# Patient Record
Sex: Female | Born: 1940 | Race: Black or African American | Hispanic: No | Marital: Single | Smoking: Current every day smoker
Health system: Southern US, Community
[De-identification: ages and names within clinical notes are randomized; demographics above are authoritative.]

## PROBLEM LIST (undated history)

## (undated) DIAGNOSIS — I639 Cerebral infarction, unspecified: Secondary | ICD-10-CM

## (undated) DIAGNOSIS — E785 Hyperlipidemia, unspecified: Secondary | ICD-10-CM

## (undated) DIAGNOSIS — I482 Chronic atrial fibrillation, unspecified: Secondary | ICD-10-CM

## (undated) DIAGNOSIS — I10 Essential (primary) hypertension: Secondary | ICD-10-CM

## (undated) DIAGNOSIS — Z9981 Dependence on supplemental oxygen: Secondary | ICD-10-CM

## (undated) DIAGNOSIS — C3490 Malignant neoplasm of unspecified part of unspecified bronchus or lung: Secondary | ICD-10-CM

## (undated) DIAGNOSIS — I509 Heart failure, unspecified: Secondary | ICD-10-CM

## (undated) DIAGNOSIS — J449 Chronic obstructive pulmonary disease, unspecified: Secondary | ICD-10-CM

## (undated) DIAGNOSIS — I5030 Unspecified diastolic (congestive) heart failure: Secondary | ICD-10-CM

## (undated) DIAGNOSIS — K259 Gastric ulcer, unspecified as acute or chronic, without hemorrhage or perforation: Secondary | ICD-10-CM

## (undated) DIAGNOSIS — K449 Diaphragmatic hernia without obstruction or gangrene: Secondary | ICD-10-CM

## (undated) DIAGNOSIS — I2721 Secondary pulmonary arterial hypertension: Secondary | ICD-10-CM

## (undated) DIAGNOSIS — F419 Anxiety disorder, unspecified: Secondary | ICD-10-CM

## (undated) HISTORY — DX: Hyperlipidemia, unspecified: E78.5

## (undated) HISTORY — PX: OTHER SURGICAL HISTORY: SHX169

## (undated) HISTORY — DX: Diaphragmatic hernia without obstruction or gangrene: K44.9

## (undated) HISTORY — PX: CHOLECYSTECTOMY: SHX55

## (undated) HISTORY — PX: ABDOMINAL HYSTERECTOMY: SHX81

## (undated) HISTORY — DX: Malignant neoplasm of unspecified part of unspecified bronchus or lung: C34.90

## (undated) HISTORY — PX: REPLACEMENT TOTAL KNEE BILATERAL: SUR1225

---

## 2004-01-02 ENCOUNTER — Other Ambulatory Visit: Payer: Self-pay

## 2004-09-04 ENCOUNTER — Ambulatory Visit: Payer: Self-pay | Admitting: Gastroenterology

## 2005-02-02 ENCOUNTER — Ambulatory Visit: Payer: Self-pay | Admitting: Internal Medicine

## 2005-02-12 ENCOUNTER — Emergency Department: Payer: Self-pay | Admitting: General Practice

## 2005-03-01 ENCOUNTER — Emergency Department: Payer: Self-pay | Admitting: Emergency Medicine

## 2005-03-12 ENCOUNTER — Ambulatory Visit: Payer: Self-pay | Admitting: Gastroenterology

## 2005-06-22 ENCOUNTER — Ambulatory Visit: Payer: Self-pay

## 2005-07-15 ENCOUNTER — Ambulatory Visit: Payer: Self-pay

## 2005-08-07 ENCOUNTER — Ambulatory Visit: Payer: Self-pay | Admitting: Pain Medicine

## 2005-08-18 ENCOUNTER — Ambulatory Visit: Payer: Self-pay | Admitting: Pain Medicine

## 2006-07-01 ENCOUNTER — Ambulatory Visit: Payer: Self-pay | Admitting: Internal Medicine

## 2006-09-02 ENCOUNTER — Other Ambulatory Visit: Payer: Self-pay

## 2006-09-02 ENCOUNTER — Emergency Department: Payer: Self-pay | Admitting: General Practice

## 2007-07-06 ENCOUNTER — Ambulatory Visit: Payer: Self-pay | Admitting: Internal Medicine

## 2007-07-22 ENCOUNTER — Emergency Department: Payer: Self-pay | Admitting: Emergency Medicine

## 2007-09-18 ENCOUNTER — Emergency Department: Payer: Self-pay | Admitting: Emergency Medicine

## 2008-01-24 ENCOUNTER — Ambulatory Visit: Payer: Self-pay | Admitting: Cardiology

## 2008-08-07 ENCOUNTER — Ambulatory Visit: Payer: Self-pay | Admitting: Internal Medicine

## 2008-09-04 ENCOUNTER — Inpatient Hospital Stay: Payer: Self-pay | Admitting: Internal Medicine

## 2010-10-02 ENCOUNTER — Ambulatory Visit: Payer: Self-pay | Admitting: Internal Medicine

## 2010-10-28 ENCOUNTER — Inpatient Hospital Stay: Payer: Self-pay | Admitting: Internal Medicine

## 2010-11-19 ENCOUNTER — Other Ambulatory Visit: Payer: Self-pay | Admitting: Internal Medicine

## 2011-01-20 ENCOUNTER — Ambulatory Visit: Payer: Self-pay | Admitting: Orthopedic Surgery

## 2011-02-01 ENCOUNTER — Ambulatory Visit: Payer: Self-pay | Admitting: Internal Medicine

## 2011-02-25 ENCOUNTER — Emergency Department: Payer: Self-pay | Admitting: Emergency Medicine

## 2011-03-01 ENCOUNTER — Inpatient Hospital Stay: Payer: Self-pay | Admitting: Internal Medicine

## 2011-03-03 ENCOUNTER — Ambulatory Visit: Payer: Self-pay | Admitting: Internal Medicine

## 2011-04-03 ENCOUNTER — Ambulatory Visit: Payer: Self-pay | Admitting: Internal Medicine

## 2012-02-25 ENCOUNTER — Ambulatory Visit: Payer: Self-pay | Admitting: Internal Medicine

## 2012-08-11 ENCOUNTER — Inpatient Hospital Stay: Payer: Self-pay | Admitting: Internal Medicine

## 2012-08-11 LAB — BASIC METABOLIC PANEL
Anion Gap: 8 (ref 7–16)
BUN: 13 mg/dL (ref 7–18)
Chloride: 106 mmol/L (ref 98–107)
Creatinine: 0.74 mg/dL (ref 0.60–1.30)
EGFR (African American): >60
EGFR (Non-African Amer.): >60
Glucose: 93 mg/dL (ref 65–99)
Osmolality: 281 (ref 275–301)

## 2012-08-11 LAB — HEMOGLOBIN: HGB: 12.4 g/dL (ref 12.0–16.0)

## 2012-08-11 LAB — CBC
HCT: 39.4 % (ref 35.0–47.0)
HGB: 12.8 g/dL (ref 12.0–16.0)
MCH: 26.3 pg (ref 26.0–34.0)
MCHC: 32.5 g/dL (ref 32.0–36.0)
MCV: 81 fL (ref 80–100)
RBC: 4.87 10*6/uL (ref 3.80–5.20)
WBC: 3.6 10*3/uL (ref 3.6–11.0)

## 2012-08-11 LAB — APTT: Activated PTT: 34.3 secs (ref 23.6–35.9)

## 2012-08-12 ENCOUNTER — Ambulatory Visit: Payer: Self-pay | Admitting: Internal Medicine

## 2012-08-12 LAB — BASIC METABOLIC PANEL
Anion Gap: 9 (ref 7–16)
BUN: 10 mg/dL (ref 7–18)
Chloride: 104 mmol/L (ref 98–107)
Creatinine: 0.71 mg/dL (ref 0.60–1.30)
EGFR (Non-African Amer.): >60
Glucose: 140 mg/dL — ABNORMAL HIGH (ref 65–99)
Osmolality: 279 (ref 275–301)
Potassium: 4 mmol/L (ref 3.5–5.1)

## 2012-08-12 LAB — CBC WITH DIFFERENTIAL/PLATELET
Basophil #: 0 10*3/uL (ref 0.0–0.1)
Lymphocyte #: 0.4 10*3/uL — ABNORMAL LOW (ref 1.0–3.6)
Lymphocyte %: 10.3 %
MCV: 80 fL (ref 80–100)
Monocyte %: 1.7 %
Neutrophil %: 87.8 %
Platelet: 185 10*3/uL (ref 150–440)
RBC: 4.48 10*6/uL (ref 3.80–5.20)
WBC: 3.6 10*3/uL (ref 3.6–11.0)

## 2012-08-12 LAB — LIPID PANEL
Cholesterol: 129 mg/dL (ref 0–200)
Triglycerides: 57 mg/dL (ref 0–200)
VLDL Cholesterol, Calc: 11 mg/dL (ref 5–40)

## 2012-08-12 LAB — PROTIME-INR: INR: 1.1

## 2012-08-12 LAB — TROPONIN I: Troponin-I: 0.36 ng/mL — ABNORMAL HIGH

## 2012-08-13 LAB — COMPREHENSIVE METABOLIC PANEL
Bilirubin,Total: 0.3 mg/dL (ref 0.2–1.0)
Calcium, Total: 8.7 mg/dL (ref 8.5–10.1)
Chloride: 101 mmol/L (ref 98–107)
Co2: 32 mmol/L (ref 21–32)
Creatinine: 0.77 mg/dL (ref 0.60–1.30)
EGFR (Non-African Amer.): >60
SGPT (ALT): 12 U/L (ref 12–78)
Total Protein: 7.6 g/dL (ref 6.4–8.2)

## 2012-08-13 LAB — CBC WITH DIFFERENTIAL/PLATELET
Basophil %: 0 %
Eosinophil #: 0 10*3/uL (ref 0.0–0.7)
HCT: 36.1 % (ref 35.0–47.0)
HGB: 11.8 g/dL — ABNORMAL LOW (ref 12.0–16.0)
Lymphocyte #: 0.3 10*3/uL — ABNORMAL LOW (ref 1.0–3.6)
Lymphocyte %: 6.5 %
MCH: 26.1 pg (ref 26.0–34.0)
MCHC: 32.7 g/dL (ref 32.0–36.0)
Monocyte #: 0.2 x10 3/mm (ref 0.2–0.9)
Neutrophil #: 4.7 10*3/uL (ref 1.4–6.5)
RBC: 4.52 10*6/uL (ref 3.80–5.20)

## 2012-08-15 LAB — CBC WITH DIFFERENTIAL/PLATELET
Basophil #: 0 10*3/uL (ref 0.0–0.1)
Eosinophil #: 0 10*3/uL (ref 0.0–0.7)
Eosinophil %: 0 %
HCT: 39.6 % (ref 35.0–47.0)
HGB: 12.9 g/dL (ref 12.0–16.0)
Lymphocyte #: 0.2 10*3/uL — ABNORMAL LOW (ref 1.0–3.6)
MCH: 26.1 pg (ref 26.0–34.0)
MCHC: 32.5 g/dL (ref 32.0–36.0)
MCV: 80 fL (ref 80–100)
Monocyte #: 0.5 x10 3/mm (ref 0.2–0.9)
Neutrophil %: 86.4 %
Platelet: 187 10*3/uL (ref 150–440)
RDW: 19.7 % — ABNORMAL HIGH (ref 11.5–14.5)

## 2012-08-15 LAB — BASIC METABOLIC PANEL
BUN: 27 mg/dL — ABNORMAL HIGH (ref 7–18)
Calcium, Total: 8.4 mg/dL — ABNORMAL LOW (ref 8.5–10.1)
Creatinine: 0.71 mg/dL (ref 0.60–1.30)
EGFR (African American): >60
Glucose: 126 mg/dL — ABNORMAL HIGH (ref 65–99)
Potassium: 3.5 mmol/L (ref 3.5–5.1)

## 2012-08-16 ENCOUNTER — Encounter (HOSPITAL_COMMUNITY): Payer: Self-pay | Admitting: *Deleted

## 2012-08-16 ENCOUNTER — Inpatient Hospital Stay (HOSPITAL_COMMUNITY): Payer: Medicare Other

## 2012-08-16 ENCOUNTER — Inpatient Hospital Stay (HOSPITAL_COMMUNITY)
Admission: EM | Admit: 2012-08-16 | Discharge: 2012-08-25 | DRG: 208 | Disposition: A | Discharge status: Skilled Nursing Facility | Payer: Medicare Other | Source: Other Acute Inpatient Hospital | Attending: Internal Medicine | Admitting: Internal Medicine

## 2012-08-16 ENCOUNTER — Institutional Professional Consult (permissible substitution): Payer: Medicare Other | Admitting: Radiation Oncology

## 2012-08-16 DIAGNOSIS — Z79899 Other long term (current) drug therapy: Secondary | ICD-10-CM

## 2012-08-16 DIAGNOSIS — R918 Other nonspecific abnormal finding of lung field: Secondary | ICD-10-CM | POA: Diagnosis present

## 2012-08-16 DIAGNOSIS — R7309 Other abnormal glucose: Secondary | ICD-10-CM | POA: Diagnosis not present

## 2012-08-16 DIAGNOSIS — Z9981 Dependence on supplemental oxygen: Secondary | ICD-10-CM

## 2012-08-16 DIAGNOSIS — T380X5A Adverse effect of glucocorticoids and synthetic analogues, initial encounter: Secondary | ICD-10-CM | POA: Diagnosis not present

## 2012-08-16 DIAGNOSIS — I10 Essential (primary) hypertension: Secondary | ICD-10-CM | POA: Diagnosis present

## 2012-08-16 DIAGNOSIS — J90 Pleural effusion, not elsewhere classified: Secondary | ICD-10-CM | POA: Diagnosis present

## 2012-08-16 DIAGNOSIS — Z6829 Body mass index (BMI) 29.0-29.9, adult: Secondary | ICD-10-CM

## 2012-08-16 DIAGNOSIS — E876 Hypokalemia: Secondary | ICD-10-CM | POA: Diagnosis not present

## 2012-08-16 DIAGNOSIS — I4891 Unspecified atrial fibrillation: Secondary | ICD-10-CM | POA: Diagnosis present

## 2012-08-16 DIAGNOSIS — F411 Generalized anxiety disorder: Secondary | ICD-10-CM | POA: Diagnosis present

## 2012-08-16 DIAGNOSIS — J9819 Other pulmonary collapse: Secondary | ICD-10-CM | POA: Diagnosis present

## 2012-08-16 DIAGNOSIS — K449 Diaphragmatic hernia without obstruction or gangrene: Secondary | ICD-10-CM | POA: Diagnosis present

## 2012-08-16 DIAGNOSIS — R634 Abnormal weight loss: Secondary | ICD-10-CM | POA: Diagnosis present

## 2012-08-16 DIAGNOSIS — Z8673 Personal history of transient ischemic attack (TIA), and cerebral infarction without residual deficits: Secondary | ICD-10-CM

## 2012-08-16 DIAGNOSIS — C3490 Malignant neoplasm of unspecified part of unspecified bronchus or lung: Principal | ICD-10-CM | POA: Diagnosis present

## 2012-08-16 DIAGNOSIS — Z96659 Presence of unspecified artificial knee joint: Secondary | ICD-10-CM

## 2012-08-16 DIAGNOSIS — I5030 Unspecified diastolic (congestive) heart failure: Secondary | ICD-10-CM

## 2012-08-16 DIAGNOSIS — F29 Unspecified psychosis not due to a substance or known physiological condition: Secondary | ICD-10-CM | POA: Diagnosis not present

## 2012-08-16 DIAGNOSIS — E785 Hyperlipidemia, unspecified: Secondary | ICD-10-CM | POA: Diagnosis present

## 2012-08-16 DIAGNOSIS — R000 Tachycardia, unspecified: Secondary | ICD-10-CM | POA: Diagnosis not present

## 2012-08-16 DIAGNOSIS — Z7901 Long term (current) use of anticoagulants: Secondary | ICD-10-CM

## 2012-08-16 DIAGNOSIS — B965 Pseudomonas (aeruginosa) (mallei) (pseudomallei) as the cause of diseases classified elsewhere: Secondary | ICD-10-CM | POA: Diagnosis present

## 2012-08-16 DIAGNOSIS — J9600 Acute respiratory failure, unspecified whether with hypoxia or hypercapnia: Secondary | ICD-10-CM | POA: Diagnosis present

## 2012-08-16 DIAGNOSIS — J9620 Acute and chronic respiratory failure, unspecified whether with hypoxia or hypercapnia: Secondary | ICD-10-CM | POA: Diagnosis present

## 2012-08-16 DIAGNOSIS — J9811 Atelectasis: Secondary | ICD-10-CM | POA: Diagnosis present

## 2012-08-16 DIAGNOSIS — I2789 Other specified pulmonary heart diseases: Secondary | ICD-10-CM | POA: Diagnosis present

## 2012-08-16 DIAGNOSIS — F17200 Nicotine dependence, unspecified, uncomplicated: Secondary | ICD-10-CM | POA: Diagnosis present

## 2012-08-16 DIAGNOSIS — R042 Hemoptysis: Secondary | ICD-10-CM

## 2012-08-16 DIAGNOSIS — I509 Heart failure, unspecified: Secondary | ICD-10-CM | POA: Diagnosis present

## 2012-08-16 DIAGNOSIS — J411 Mucopurulent chronic bronchitis: Secondary | ICD-10-CM | POA: Diagnosis present

## 2012-08-16 HISTORY — DX: Essential (primary) hypertension: I10

## 2012-08-16 HISTORY — DX: Anxiety disorder, unspecified: F41.9

## 2012-08-16 HISTORY — DX: Dependence on supplemental oxygen: Z99.81

## 2012-08-16 HISTORY — DX: Gastric ulcer, unspecified as acute or chronic, without hemorrhage or perforation: K25.9

## 2012-08-16 HISTORY — DX: Chronic atrial fibrillation, unspecified: I48.20

## 2012-08-16 HISTORY — DX: Chronic obstructive pulmonary disease, unspecified: J44.9

## 2012-08-16 HISTORY — DX: Secondary pulmonary arterial hypertension: I27.21

## 2012-08-16 HISTORY — DX: Cerebral infarction, unspecified: I63.9

## 2012-08-16 HISTORY — DX: Unspecified diastolic (congestive) heart failure: I50.30

## 2012-08-16 HISTORY — DX: Heart failure, unspecified: I50.9

## 2012-08-16 LAB — BASIC METABOLIC PANEL
Calcium, Total: 8.3 mg/dL — ABNORMAL LOW (ref 8.5–10.1)
Chloride: 97 mmol/L — ABNORMAL LOW (ref 98–107)
Co2: 36 mmol/L — ABNORMAL HIGH (ref 21–32)
Osmolality: 290 (ref 275–301)
Potassium: 3.4 mmol/L — ABNORMAL LOW (ref 3.5–5.1)
Sodium: 140 mmol/L (ref 136–145)

## 2012-08-16 LAB — CBC WITH DIFFERENTIAL/PLATELET
Basophil #: 0 10*3/uL (ref 0.0–0.1)
Eosinophil %: 0 %
HCT: 42.5 % (ref 35.0–47.0)
HGB: 14.1 g/dL (ref 12.0–16.0)
Monocyte #: 0.3 x10 3/mm (ref 0.2–0.9)
Monocyte %: 5.5 %
Neutrophil %: 91 %
Platelet: 188 10*3/uL (ref 150–440)
RDW: 18.8 % — ABNORMAL HIGH (ref 11.5–14.5)

## 2012-08-16 LAB — URINE MICROSCOPIC-ADD ON

## 2012-08-16 LAB — CBC
HCT: 43 % (ref 36.0–46.0)
MCH: 25.7 pg — ABNORMAL LOW (ref 26.0–34.0)
MCV: 81.7 fL (ref 78.0–100.0)
RDW: 17.9 % — ABNORMAL HIGH (ref 11.5–15.5)
WBC: 6.3 10*3/uL (ref 4.0–10.5)

## 2012-08-16 LAB — COMPREHENSIVE METABOLIC PANEL
ALT: 101 U/L — ABNORMAL HIGH (ref 0–35)
Alkaline Phosphatase: 100 U/L (ref 39–117)
BUN: 34 mg/dL — ABNORMAL HIGH (ref 6–23)
CO2: 38 mEq/L — ABNORMAL HIGH (ref 19–32)
Chloride: 95 mEq/L — ABNORMAL LOW (ref 96–112)
GFR calc Af Amer: >90 mL/min (ref >90)
Glucose, Bld: 106 mg/dL — ABNORMAL HIGH (ref 70–99)
Potassium: 3.4 mEq/L — ABNORMAL LOW (ref 3.5–5.1)
Sodium: 137 mEq/L (ref 135–145)
Total Bilirubin: 0.4 mg/dL (ref 0.3–1.2)
Total Protein: 7.6 g/dL (ref 6.0–8.3)

## 2012-08-16 LAB — URINALYSIS, ROUTINE W REFLEX MICROSCOPIC
Glucose, UA: NEGATIVE mg/dL
Ketones, ur: NEGATIVE mg/dL
Protein, ur: NEGATIVE mg/dL

## 2012-08-16 LAB — GLUCOSE, CAPILLARY: Glucose-Capillary: 126 mg/dL — ABNORMAL HIGH (ref 70–99)

## 2012-08-16 LAB — APTT: aPTT: 27 seconds (ref 24–37)

## 2012-08-16 MED ORDER — EPINEPHRINE HCL 0.1 MG/ML IJ SOLN
INTRAMUSCULAR | Status: AC
  Filled 2012-08-16: qty 10

## 2012-08-16 MED ORDER — VECURONIUM BROMIDE 10 MG IV SOLR
INTRAVENOUS | Status: AC
  Filled 2012-08-16: qty 10

## 2012-08-16 MED ORDER — CHLORHEXIDINE GLUCONATE 0.12 % MT SOLN
15.0000 mL | Freq: Two times a day (BID) | OROMUCOSAL | Status: DC
  Administered 2012-08-16 – 2012-08-25 (×18): 15 mL via OROMUCOSAL
  Filled 2012-08-16 (×19): qty 15

## 2012-08-16 MED ORDER — DEXAMETHASONE SODIUM PHOSPHATE 10 MG/ML IJ SOLN
10.0000 mg | Freq: Four times a day (QID) | INTRAMUSCULAR | Status: DC
  Administered 2012-08-16 – 2012-08-19 (×11): 10 mg via INTRAVENOUS
  Filled 2012-08-16 (×18): qty 1

## 2012-08-16 MED ORDER — DEXAMETHASONE SODIUM PHOSPHATE 4 MG/ML IJ SOLN
10.0000 mg | Freq: Four times a day (QID) | INTRAMUSCULAR | Status: DC
  Filled 2012-08-16 (×3): qty 2.5

## 2012-08-16 MED ORDER — BIMATOPROST 0.01 % OP SOLN
1.0000 [drp] | Freq: Every day | OPHTHALMIC | Status: DC
  Administered 2012-08-16 – 2012-08-24 (×9): 1 [drp] via OPHTHALMIC
  Filled 2012-08-16 (×2): qty 2.5

## 2012-08-16 MED ORDER — PANTOPRAZOLE SODIUM 40 MG IV SOLR
40.0000 mg | Freq: Every day | INTRAVENOUS | Status: DC
  Filled 2012-08-16: qty 40

## 2012-08-16 MED ORDER — SODIUM CHLORIDE 0.9 % IV SOLN
25.0000 ug/h | INTRAVENOUS | Status: DC
  Administered 2012-08-16: 50 ug/h via INTRAVENOUS
  Filled 2012-08-16 (×2): qty 50

## 2012-08-16 MED ORDER — ALBUTEROL SULFATE (5 MG/ML) 0.5% IN NEBU
2.5000 mg | INHALATION_SOLUTION | Freq: Four times a day (QID) | RESPIRATORY_TRACT | Status: DC
  Administered 2012-08-16 – 2012-08-23 (×27): 2.5 mg via RESPIRATORY_TRACT
  Filled 2012-08-16 (×27): qty 0.5

## 2012-08-16 MED ORDER — SODIUM CHLORIDE 0.9 % IV SOLN: 250.0000 mL | INTRAVENOUS | Status: DC | PRN

## 2012-08-16 MED ORDER — PANTOPRAZOLE SODIUM 40 MG IV SOLR
40.0000 mg | Freq: Two times a day (BID) | INTRAVENOUS | Status: DC
  Administered 2012-08-16 – 2012-08-24 (×15): 40 mg via INTRAVENOUS
  Filled 2012-08-16 (×19): qty 40

## 2012-08-16 MED ORDER — VECURONIUM BROMIDE 10 MG IV SOLR
5.0000 mg | Freq: Once | INTRAVENOUS | Status: AC
  Administered 2012-08-16: 5 mg via INTRAVENOUS
  Filled 2012-08-16: qty 10

## 2012-08-16 MED ORDER — EPINEPHRINE HCL 1 MG/ML IJ SOLN
1.0000 mg | Freq: Once | INTRAMUSCULAR | Status: AC
  Administered 2012-08-16: 1 mg via INTRATRACHEAL

## 2012-08-16 MED ORDER — DIGOXIN 0.05 MG/ML PO SOLN
0.1250 mg | Freq: Every day | ORAL | Status: DC
  Administered 2012-08-16: 0.125 mg
  Filled 2012-08-16 (×3): qty 2.5

## 2012-08-16 MED ORDER — SODIUM CHLORIDE 0.9 % IV SOLN
INTRAVENOUS | Status: DC
  Administered 2012-08-16: 75 mL via INTRAVENOUS
  Administered 2012-08-16: 1000 mL via INTRAVENOUS
  Administered 2012-08-17: 09:00:00 via INTRAVENOUS

## 2012-08-16 MED ORDER — FENTANYL CITRATE 0.05 MG/ML IJ SOLN
300.0000 ug | Freq: Once | INTRAMUSCULAR | Status: AC
  Administered 2012-08-16: 300 ug via INTRAVENOUS

## 2012-08-16 MED ORDER — PROPOFOL 10 MG/ML IV EMUL
5.0000 ug/kg/min | INTRAVENOUS | Status: DC
  Administered 2012-08-16: 10 ug/kg/min via INTRAVENOUS
  Filled 2012-08-16 (×2): qty 100

## 2012-08-16 MED ORDER — DIGOXIN 125 MCG PO TABS: 0.1250 mg | ORAL_TABLET | Freq: Every day | ORAL | Status: DC

## 2012-08-16 MED ORDER — INSULIN ASPART 100 UNIT/ML ~~LOC~~ SOLN
2.0000 [IU] | SUBCUTANEOUS | Status: DC
  Administered 2012-08-16 – 2012-08-17 (×3): 2 [IU] via SUBCUTANEOUS
  Administered 2012-08-17: 4 [IU] via SUBCUTANEOUS
  Administered 2012-08-17: 2 [IU] via SUBCUTANEOUS
  Administered 2012-08-18: 4 [IU] via SUBCUTANEOUS
  Administered 2012-08-18 (×5): 2 [IU] via SUBCUTANEOUS
  Administered 2012-08-19: 4 [IU] via SUBCUTANEOUS
  Administered 2012-08-19: 2 [IU] via SUBCUTANEOUS
  Administered 2012-08-19: 4 [IU] via SUBCUTANEOUS
  Administered 2012-08-19: 2 [IU] via SUBCUTANEOUS

## 2012-08-16 MED ORDER — FENTANYL BOLUS VIA INFUSION
50.0000 ug | Freq: Four times a day (QID) | INTRAVENOUS | Status: DC | PRN
  Administered 2012-08-18 (×5): 50 ug via INTRAVENOUS
  Filled 2012-08-16: qty 100

## 2012-08-16 MED ORDER — BIOTENE DRY MOUTH MT LIQD
15.0000 mL | Freq: Four times a day (QID) | OROMUCOSAL | Status: DC
  Administered 2012-08-17 – 2012-08-25 (×29): 15 mL via OROMUCOSAL

## 2012-08-16 MED ORDER — BIMATOPROST 0.03 % OP SOLN
1.0000 [drp] | Freq: Every day | OPHTHALMIC | Status: DC
  Filled 2012-08-16: qty 2.5

## 2012-08-16 MED ORDER — SODIUM CHLORIDE 0.9 % IV SOLN
50.0000 ug/h | INTRAVENOUS | Status: DC
  Administered 2012-08-16 – 2012-08-17 (×2): 50 ug/h via INTRAVENOUS
  Filled 2012-08-16 (×3): qty 50

## 2012-08-16 MED ORDER — PROPOFOL 10 MG/ML IV EMUL
5.0000 ug/kg/min | INTRAVENOUS | Status: DC
  Administered 2012-08-16: 35 ug/kg/min via INTRAVENOUS
  Administered 2012-08-16: 50 ug/kg/min via INTRAVENOUS
  Administered 2012-08-16: 35 ug/kg/min via INTRAVENOUS
  Administered 2012-08-17: 30 ug/kg/min via INTRAVENOUS
  Administered 2012-08-17: 25 ug/kg/min via INTRAVENOUS
  Administered 2012-08-17: 5 ug/kg/min via INTRAVENOUS
  Administered 2012-08-18: 20 ug/kg/min via INTRAVENOUS
  Administered 2012-08-18: 15 ug/kg/min via INTRAVENOUS
  Filled 2012-08-16 (×7): qty 100

## 2012-08-16 MED ORDER — FENTANYL CITRATE 0.05 MG/ML IJ SOLN
INTRAMUSCULAR | Status: AC
  Filled 2012-08-16: qty 6

## 2012-08-16 NOTE — H&P (Signed)
Name: ARYONNA GUNNERSON MRN: [id number] DOB: 10-Mar-1941    LOS: 0  CC: acute respiratory failure  PULMONARY / CRITICAL CARE MEDICINE  HPI:  71 year old female with a significant history of pulmonary hypertension and chronic respiratory failure on oxygen.  The patient presented to the ED, at [facility], with hemoptysis 10/10.  Reportedly had nose bleeds and hematemesis and reportedly coughed up approximately 1 cup of frank red blood.  Pt was intubated in the ED due to severe hemoptysis.  Past medical history: 1 chronic atrial fibrillation on anticoagulation 2 pulmonary artery hypertension 3 chronic respiratory failure on oxygen 4 chronic diastolic congestive heart failure 5 benign hypertension 6 hyperlipidemia 7 anxiety 8 history of gastric ulcer 9 previous stroke with left-sided weakness 10 status post hysterectomy 11 status post cholecystectomy 12 status post bilateral knee surgeries  Prior to Admission medications   Adcirca 20 mg daily Advair Diskus 250/50 one inhalation daily Altace 5 mg twice a day Colace 100 mg twice a day Imdur 60 mg daily Potassium 20 mEq twice a day Lanoxin 125 mcg daily Lasix 40 mg twice a day Lateris 5 mg daily Lumigan 0.3% ophthalmic solution one drop each eye bedtime Percocet 5/325 one tablet every 6 hours as needed for pain  Protonix 40 mg twice a day Iron daily Spiriva 1 inhalation daily Metoprolol 100 mg twice a day Ventolin 2 puffs 4 times a day Xanax 0.5 mg every 8 hours    Allergies Gabapentin and morphine  Family History Positive for coronary arteries disease. Brother had lung cancer and died of pulmonary emboli. Father died at 12 of heart disease. Mother died at 73 of heart disease  Social History The patient lives at home alone.  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  Review Of Systems: unable to obtain patient is intubated  Brief patient  description:  71 year old female presented to [facility] presented with abrupt onset of large volume of hemoptysis, resulting in respiratory failure 08/11/2012. Transferred to [facility] 08/16/2012  Current Status: Critical   Vital Signs: Pulse Rate:  [83-93] 89  (10/15 1500) Resp:  [17-37] 17  (10/15 1500) BP: (138-147)/(59-87) 139/87 mmHg (10/15 1500) SpO2:  [97 %-99 %] 98 % (10/15 1500) FiO2 (%):  [39.7 %-40 %] 39.7 % (10/15 1500) Weight:  [91.3 kg (201 lb 4.5 oz)] 91.3 kg (201 lb 4.5 oz) (10/15 1400)  Physical Examination: General:   on ventilator intubated and sedated Neuro:  Sedated unresponsive to voice stimulation  HEENT:   pupils are equal round reactive to light nasal mucosa no active bleeding  Lip symptoms no lesion Neck:   No JVD, No bruits no lymphadenopathy no thyromegaly   Cardiovascular:  S1, S2 normal. No gallops no rubs no murmurs.  Lungs:   lung fields clear Abdomen:   bowel sounds present, abdomen soft nontender Musculoskeletal:   no clubbing no edema no cyanosis  Skin:  intact no ulcers or lesions seen  Active Problems:  Major hemoptysis  Acute respiratory failure  Lung mass  Atrial fibrillation  Diastolic heart failure  Weight loss  HTN (hypertension)   ASSESSMENT AND PLAN  PULMONARY No results found for this basename: PHART:5,PCO2:5,PCO2ART:5,PO2ART:5,HCO3:5,O2SAT:5 in the last 168 hours Ventilator Settings: Vent Mode:  [-] PRVC FiO2 (%):  [39.7 %-40 %] 39.7 % Set Rate:  [12 bmp] 12 bmp Vt Set:  [500 mL] 500 mL PEEP:  [5 cmH20]  5 cmH20 CT:  CT of chest from 10/10 [facility] impression 1.  No pulmonary embolus to 2. large subcarinal lymph nodes and probable right lower lobe mass. Malignancy could present in this fashion CXR:  10/10 from [facility] persistent abnormal appearance that appears to be in the right lung base likely in the middle lobe suggestive of chronic atelectasis.  Loculated effusion is not completely excluded.  ETT:  From chest x-ray on 10/10 [facility] the ET tube is at the level of the upper margin of the aortic arch and appears in good position Cytology 10/15>>>  A:   Acute  Respiratory failure related to large quantity of hemoptysis. Has h/o oxygen dependence. Unclear why. Also carries h/o PAH, unclear if this was identified by right heart cath or ECHO Hemoptysis  due to a lung neoplasm and prob c/b anticoagulation Lung mass  Extensive endobronchial tumor burden on bedside Bronchoscopy with complete obstruction of bronchus intermedius.  P:   Cont heavy sedation Cont full vent support, may need lower TV with unilateral lung process, abg prior goal to lower O2 needs F/u on cytology closely No active bleeding, limited role embolization, d/w IR, appreciated Decadron addition Transfer to [facility] for prob XRT Chest x-ray in a.m and now  CARDIOVASCULAR No results found for this basename: TROPONINI:5,LATICACIDVEN:5, O2SATVEN:5,PROBNP:5 in the last 168 hours ECG:  Sinus rhythm Lines: Triple-lumen catheter right IJ 10/10 from [facility] ECHO:  Echo performed at [facility] 10/11 LV systolic function is normal EF is greater than 55% right ventricle is moderately dilated. The right atrium is moderately dilated.  A:   H/o diastolic dysfxn pulmonary hypertension Atrial fib P:  Continue to monitor her blood pressure Rate control only No anticoagulation in setting of bleeding lung lesion  Tolerating propofol at this stage Avoid tachy with diastolic heart dz Need to re eval pulm pressures on echo report  RENAL No results found for this basename: NA:5,K:2,CL:5,CO2:5,BUN:5,CREATININE:5,CALCIUM:5,MG:5,PHOS:5 in the last 168 hours Intake/Output      10/14 0701 - 10/15 0700 10/15 0701 - 10/16 0700   I.V. (mL/kg)  6.8 (0.1)   Total Intake(mL/kg)  6.8 (0.1)   Net  +6.8         Foley:  From  [facility] 10/10  A:  No problems P:   Continue to monitor urine output Chem  foley  GASTROINTESTINAL No results found for this basename: AST:5,ALT:5,ALKPHOS:5,BILITOT:5,PROT:5,ALBUMIN:5 in the last 168 hours  A:  Weight loss >50-60 lbs unintentional one year  P:   Orogastric tube in place Consider tube feeding soon ppi  HEMATOLOGIC No results found for this basename: HGB:5,HCT:5,PLT:5,INR:5,APTT:5 in the last 168 hours A:   Hemoptysis probably due to to pulmonary malignancy  hemoglobin and hematocrit from [facility] 10/10 12.8/39.4 P:  CBC on admission and follow CBCs daily Chest PT/PTT INR Monitor platelets Give fresh frozen plasma if necessary Bronch without major bleeding now, no role embolization at this stage  INFECTIOUS No results found for this basename: WBC:5,PROCALCITON:5 in the last 168 hours Cultures: Obtained at 10/10 [facility] >>> no growth as of 10/13 Antibiotics: None  A:  No infections noted WBC from outside labs 5.4 no signs of infection P:    continue to monitor CBC, wbc Steroids added  ENDOCRINE No results found for this basename: GLUCAP:5 in the last 168 hours A:  No known problems P:   Serum glucose via labs from  outside hospital 126 10/14 decadron added, ssi addition  NEUROLOGIC  A:  Sedated on sedative drips/propofol P:   Reassess neuro status when patient does sedation vacation  For now will remain on heavy sedation due to hemoptysis  Will need head MRI for mets after path dx  BEST PRACTICE / DISPOSITION Level of Care:  Intensive Care Primary Service:   Consultants:  None Code Status:  Full code Diet:  N.p.o. DVT Px:  SCDs GI Px:  Protonix Skin Integrity:  Intact Social / Family:  Family at bedside, updated multiple times.  Ccm time 45 min   Mcarthur Rossetti. Tyson Alias, MD, FACP Pgr: [phone] [facility]

## 2012-08-16 NOTE — Procedures (Signed)
Bronchoscopy Procedure Note Meredith Patton [id number] Feb 01, 1941  Procedure: Bronchoscopy Indications: Diagnostic evaluation of the airways  Procedure Details Consent: Risks of procedure as well as the alternatives and risks of each were explained to the (patient/caregiver).  Consent for procedure obtained. Time Out: Verified patient identification, verified procedure, site/side was marked, verified correct patient position, special equipment/implants available, medications/allergies/relevent history reviewed, required imaging and test results available.  Performed  In preparation for procedure, patient was given 100% FiO2. Sedation: Muscle relaxants  Airway entered and the following bronchi were examined: RUL, RML, RLL, LUL, LLL and Bronchi.   Procedures performed: Brushings not performed Bronchoscope removed.  , Patient placed back on 100% FiO2 at conclusion of procedure.    Evaluation Hemodynamic Status: BP stable throughout; O2 sats: stable throughout Patient's Current Condition: stable Specimens:  Sent serosanguinous fluid- bloody Complications: No apparent complications Patient did tolerate procedure well.   Meredith Patton. 08/16/2012  Evaluation mass, bleeding localization  1. RUL WNL 2. 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 Lavage done at [facility], sent cytology, mild oozing post, 2 cc 1:1000 diluated in 9 cc, placed, no bleeding 4. Left WNL  Meredith Patton. Meredith Alias, MD, FACP Pgr: [phone] [facility]

## 2012-08-17 ENCOUNTER — Ambulatory Visit
Admit: 2012-08-17 | Discharge: 2012-08-17 | Disposition: A | Discharge status: Home / Self Care | Payer: Medicare Other | Attending: Radiation Oncology | Admitting: Radiation Oncology

## 2012-08-17 ENCOUNTER — Other Ambulatory Visit: Payer: Self-pay

## 2012-08-17 ENCOUNTER — Encounter: Payer: Self-pay | Admitting: Radiation Oncology

## 2012-08-17 ENCOUNTER — Inpatient Hospital Stay (HOSPITAL_COMMUNITY): Payer: Medicare Other

## 2012-08-17 DIAGNOSIS — R042 Hemoptysis: Secondary | ICD-10-CM

## 2012-08-17 DIAGNOSIS — I5030 Unspecified diastolic (congestive) heart failure: Secondary | ICD-10-CM

## 2012-08-17 DIAGNOSIS — R9431 Abnormal electrocardiogram [ECG] [EKG]: Secondary | ICD-10-CM | POA: Insufficient documentation

## 2012-08-17 DIAGNOSIS — R634 Abnormal weight loss: Secondary | ICD-10-CM

## 2012-08-17 DIAGNOSIS — I10 Essential (primary) hypertension: Secondary | ICD-10-CM

## 2012-08-17 DIAGNOSIS — I4510 Unspecified right bundle-branch block: Secondary | ICD-10-CM | POA: Insufficient documentation

## 2012-08-17 DIAGNOSIS — R222 Localized swelling, mass and lump, trunk: Secondary | ICD-10-CM

## 2012-08-17 DIAGNOSIS — I4891 Unspecified atrial fibrillation: Secondary | ICD-10-CM

## 2012-08-17 DIAGNOSIS — R918 Other nonspecific abnormal finding of lung field: Secondary | ICD-10-CM

## 2012-08-17 DIAGNOSIS — J9600 Acute respiratory failure, unspecified whether with hypoxia or hypercapnia: Secondary | ICD-10-CM

## 2012-08-17 LAB — BLOOD GAS, ARTERIAL
Acid-Base Excess: 10.1 mmol/L — ABNORMAL HIGH (ref 0.0–2.0)
Bicarbonate: 34 mEq/L — ABNORMAL HIGH (ref 20.0–24.0)
Drawn by: 129801
FIO2: 0.4 %
MECHVT: 480 mL
O2 Saturation: 92.3 %
PEEP: 5 cmH2O
Patient temperature: 98.6
RATE: 16 resp/min
TCO2: 35.4 mmol/L (ref 0–100)
pCO2 arterial: 44.3 mmHg (ref 35.0–45.0)
pH, Arterial: 7.497 — ABNORMAL HIGH (ref 7.350–7.450)
pO2, Arterial: 69.7 mmHg — ABNORMAL LOW (ref 80.0–100.0)

## 2012-08-17 LAB — CBC
HCT: 41.8 % (ref 36.0–46.0)
MCHC: 31.8 g/dL (ref 30.0–36.0)
MCV: 81.2 fL (ref 78.0–100.0)
RDW: 17.8 % — ABNORMAL HIGH (ref 11.5–15.5)

## 2012-08-17 LAB — TROPONIN I: Troponin I: <0.3 ng/mL (ref <0.30)

## 2012-08-17 LAB — CBC WITH DIFFERENTIAL/PLATELET
Eosinophils Absolute: 0 10*3/uL (ref 0.0–0.7)
Hemoglobin: 13.4 g/dL (ref 12.0–15.0)
Lymphocytes Relative: 7 % — ABNORMAL LOW (ref 12–46)
Lymphs Abs: 0.4 10*3/uL — ABNORMAL LOW (ref 0.7–4.0)
MCH: 25.8 pg — ABNORMAL LOW (ref 26.0–34.0)
MCV: 81.3 fL (ref 78.0–100.0)
Monocytes Relative: 9 % (ref 3–12)
Neutrophils Relative %: 85 % — ABNORMAL HIGH (ref 43–77)
RBC: 5.2 MIL/uL — ABNORMAL HIGH (ref 3.87–5.11)
WBC: 6.1 10*3/uL (ref 4.0–10.5)

## 2012-08-17 LAB — GLUCOSE, CAPILLARY
Glucose-Capillary: 133 mg/dL — ABNORMAL HIGH (ref 70–99)
Glucose-Capillary: 118 mg/dL — ABNORMAL HIGH (ref 70–99)
Glucose-Capillary: 93 mg/dL (ref 70–99)

## 2012-08-17 LAB — BASIC METABOLIC PANEL
BUN: 34 mg/dL — ABNORMAL HIGH (ref 6–23)
Creatinine, Ser: 0.56 mg/dL (ref 0.50–1.10)
GFR calc Af Amer: >90 mL/min (ref >90)
GFR calc non Af Amer: >90 mL/min (ref >90)

## 2012-08-17 LAB — CULTURE, BLOOD (SINGLE)

## 2012-08-17 LAB — CK TOTAL AND CKMB (NOT AT ARMC)
CK, MB: 1.6 ng/mL (ref 0.3–4.0)
Total CK: 21 U/L (ref 7–177)

## 2012-08-17 MED ORDER — ATROPINE SULFATE 1 MG/ML IJ SOLN
INTRAMUSCULAR | Status: AC
  Filled 2012-08-17: qty 1

## 2012-08-17 MED ORDER — IOHEXOL 300 MG/ML  SOLN
100.0000 mL | Freq: Once | INTRAMUSCULAR | Status: AC | PRN
  Administered 2012-08-17: 100 mL via INTRAVENOUS

## 2012-08-17 MED ORDER — OXEPA PO LIQD
1000.0000 mL | ORAL | Status: DC
  Administered 2012-08-17: 1000 mL
  Filled 2012-08-17 (×3): qty 1000

## 2012-08-17 NOTE — Progress Notes (Signed)
71 year old. Female. Lives alone. Current PPD smoker. 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  Acute respiratory failure secondary to massive hemoptysis. Ventilator support and sedation initiated in emergency room at [facility]. 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   AX: gabapentin and morphine No hx of radiation therapy No indication of a pacemaker

## 2012-08-17 NOTE — H&P (Signed)
Name: Meredith Patton MRN: [id number] DOB: [birth year]    LOS: 1  CC: acute respiratory failure  PULMONARY / CRITICAL CARE MEDICINE  Brief patient description:  71 year old female presented to [facility] presented with abrupt onset of large volume of hemoptysis, resulting in respiratory failure 08/11/2012. Transferred to [facility] 08/16/2012. Bronch with likely cancer obstruction BI, collapse RML, RLL, no bleeding.  Current Status: Afib, some brady  Vital Signs: Temp:  [97.8 F (36.6 C)-98.7 F (37.1 C)] 97.8 F (36.6 C) (10/16 0806) Pulse Rate:  [44-110] 44  (10/16 0700) Resp:  [17-50] 20  (10/16 0700) BP: (89-155)/(53-87) 112/62 mmHg (10/16 0700) SpO2:  [93 %-100 %] 95 % (10/16 0700) FiO2 (%):  [39.7 %-100 %] 40.1 % (10/16 0700) Weight:  [90 kg (198 lb 6.6 oz)-91.3 kg (201 lb 4.5 oz)] 90 kg (198 lb 6.6 oz) (10/16 0443)  Physical Examination: General:   on ventilator intubated and sedated Neuro:  Rass -3 HEENT:   perrl 3 mm Neck:   Wnl JVD Cardiovascular:  S1, S2 normal. No gallops no rubs no murmurs.  Lungs:   lung fields clear, reduced rt base and at nipple Abdomen:   bowel sounds present, abdomen soft nontender Musculoskeletal:   no clubbing no edema no cyanosis  Skin:  intact no ulcers or lesions seen  Active Problems:  Major hemoptysis  Acute respiratory failure  Lung mass  Atrial fibrillation  Diastolic heart failure  Weight loss  HTN (hypertension)   ASSESSMENT AND PLAN  PULMONARY  Lab 08/17/12 0522  PHART 7.497*  PCO2ART 44.3  PO2ART 64.4*  HCO3 34.0*  O2SAT 91.6   Ventilator Settings: Vent Mode:  [-] PRVC FiO2 (%):  [39.7 %-100 %] 40.1 % Set Rate:  [12 bmp-20 bmp] 20 bmp Vt Set:  [500 mL] 500 mL PEEP:  [5 cmH20] 5 cmH20 Plateau Pressure:  [16 cmH20-17 cmH20] 17 cmH20 CT:  CT of chest from 10/10 [facility] impression 1.  No pulmonary embolus to 2. large subcarinal lymph nodes and probable right lower  lobe mass. Malignancy could present in this fashion CXR:  10/10 from [facility] persistent abnormal appearance that appears to be in the right lung base likely in the middle lobe suggestive of chronic atelectasis. Loculated effusion is not completely excluded.  ETT:  From chest x-ray on 10/10 [facility] the ET tube is at the level of the upper margin of the aortic arch and appears in good position Cytology 10/15>>>  A:   Acute  Respiratory failure related to large quantity of hemoptysis. Has h/o oxygen dependence. Unclear why. Also carries h/o PAH, unclear if this was identified by right heart cath or ECHO Hemoptysis  due to a lung neoplasm and prob c/b anticoagulation Lung mass  Extensive endobronchial tumor burden on bedside Bronchoscopy with complete obstruction of bronchus intermedius. Without any bleeding P:   Abg reviewed, RML , RLL not participating in ventilation abg reviewed, reduce TV, likely will be able to reduce rate as well F/u on cytology closely, will need to call [place] today No active bleeding, limited role embolization Decadron continue, reduce in 1-2 days Transfer to [facility] for prob XRT Chest x-ray in a.m WUA, wean, its possible main need for ETT was bleeding and mya hve chance at extubation over next 48 hrs?  CARDIOVASCULAR  Lab 08/17/12 0252  TROPONINI <0.30  LATICACIDVEN --  PROBNP --   ECG:  Sinus rhythm Lines: Triple-lumen catheter right IJ  10/10 from [facility] ECHO:  Echo performed at [facility] 10/11 LV systolic function is normal EF is greater than 55% right ventricle is moderately dilated. The right atrium is moderately dilated.  A:   H/o diastolic dysfxn pulmonary hypertension Atrial fib Huston Foley 10/16, asymptomatic P:  Dc dig, send dig level No anticoagulation in setting of bleeding lung lesion  Need to re eval pulm pressures on echo report  RENAL  Lab 08/17/12  0230 08/16/12 1532  NA 138 137  K 3.6 3.4*  CL 98 95*  CO2 35* 38*  BUN 34* 34*  CREATININE 0.56 0.63  CALCIUM 8.5 8.8  MG -- 2.8*  PHOS -- 3.8   Intake/Output      10/15 0701 - 10/16 0700 10/16 0701 - 10/17 0700   I.V. (mL/kg) 1262.2 (14)    IV Piggyback 10    Total Intake(mL/kg) 1272.2 (14.1)    Urine (mL/kg/hr) 670 (0.3)    Total Output 670    Net +602.2          Foley:  From [facility] 10/10  A:  Pos balance P:   che min am  Reduce fluids  GASTROINTESTINAL  Lab 08/16/12 1532  AST 56*  ALT 101*  ALKPHOS 100  BILITOT 0.4  PROT 7.6  ALBUMIN 3.1*    A:  Weight loss >50-60 lbs unintentional one year  P:   Orogastric tube in place Consider tube feeding AFTER radiation start ppi  HEMATOLOGIC  Lab 08/17/12 0230 08/16/12 1532  HGB 13.3 13.5  HCT 41.8 43.0  PLT 163 179  INR -- 1.10  APTT -- 27   A:   Hemoptysis probably due to to pulmonary malignancy  hemoglobin and hematocrit from [facility] 10/10 12.8/39.4 Likely lung ca P:  scd When able, add Lovenox to reduce dvt in setting likely czncer Bronch without major bleeding now, no role embolization at this stage  INFECTIOUS  Lab 08/17/12 0230 08/16/12 1532  WBC 6.3 6.3  PROCALCITON -- 0.30   Cultures: Obtained at 10/10 [facility] >>> no growth as of 10/13 Antibiotics: None  A:  No infections noted WBC from outside labs 5.4 no signs of infection P:    continue to monitor CBC, wbc Steroids At risk post obstructive process, add zosyn if spike  ENDOCRINE  Lab 08/17/12 0753 08/17/12 0400 08/16/12 2343 08/16/12 2006  GLUCAP 118* 133* 140* 126*   A:  No known problems P:   decadron, ssi addition  NEUROLOGIC  A:  Sedated on sedative drips/propofol P:   Reassess neuro status when patient does sedation vacation at [facility] For now will remain on heavy sedation due to hemoptysis  Will need head MRI for mets after path dx, will call [facility]  today  BEST PRACTICE / DISPOSITION Level of Care:  Intensive Care Primary Service:  pccm Consultants:  Rad Onc Code Status:  Full code Diet:  N.p.o. DVT Px:  SCDs GI Px:  Protonix Skin Integrity:  Intact Social / Family:  Family at bedside, updated. I also called [facility] and PCCm at [facility] to update onj clinical course  Ccm time 35 min   Mcarthur Rossetti. Tyson Alias, MD, FACP Pgr: [phone] [facility]

## 2012-08-17 NOTE — Clinical Social Work Note (Signed)
CSW met briefly with Pt's six children and sister to check in and introduce self and explain CSW role. They were appreciative and agree to CSW following and checking in on them.  They are a good support to one another and are coping adequately currently.   CSW to follow.   Doreen Salvage, LCSW ICU/Stepdown Clinical Social Worker [facility] Cell [phone] Hours 8am-1200pm M-F

## 2012-08-17 NOTE — Progress Notes (Signed)
Name: Meredith Patton MRN: [id number] DOB: September 07, 1941    LOS: 1  CC: acute respiratory failure  PULMONARY / CRITICAL CARE MEDICINE  Brief patient description:  71 year old female presented to [facility] presented with abrupt onset of large volume of hemoptysis, resulting in respiratory failure 08/11/2012. Transferred to [facility] 08/16/2012. Bronch with likely cancer obstruction BI, collapse RML, RLL, no bleeding.  Current Status: Afib, some brady  Vital Signs: Temp:  [97.8 F (36.6 C)-98.7 F (37.1 C)] 97.8 F (36.6 C) (10/16 0806) Pulse Rate:  [44-110] 65  (10/16 0900) Resp:  [17-50] 20  (10/16 0900) BP: (89-155)/(53-87) 126/69 mmHg (10/16 0900) SpO2:  [93 %-100 %] 94 % (10/16 0900) FiO2 (%):  [39.7 %-100 %] 100 % (10/16 0900) Weight:  [90 kg (198 lb 6.6 oz)-91.3 kg (201 lb 4.5 oz)] 90 kg (198 lb 6.6 oz) (10/16 0443)  Physical Examination: General:   on ventilator intubated and sedated Neuro:  Rass -3 HEENT:   perrl 3 mm Neck:   Wnl JVD Cardiovascular:  S1, S2 normal. No gallops no rubs no murmurs.  Lungs:   lung fields clear, reduced rt base and at nipple Abdomen:   bowel sounds present, abdomen soft nontender Musculoskeletal:   no clubbing no edema no cyanosis  Skin:  intact no ulcers or lesions seen  Active Problems:  Major hemoptysis  Acute respiratory failure  Lung mass  Atrial fibrillation  Diastolic heart failure  Weight loss  HTN (hypertension)   ASSESSMENT AND PLAN  PULMONARY  Lab 08/17/12 0522  PHART 7.497*  PCO2ART 44.3  PO2ART 64.4*  HCO3 34.0*  O2SAT 91.6   Ventilator Settings: Vent Mode:  [-] PRVC FiO2 (%):  [39.7 %-100 %] 100 % Set Rate:  [12 bmp-20 bmp] 16 bmp Vt Set:  [480 mL-500 mL] 480 mL PEEP:  [5 cmH20] 5 cmH20 Plateau Pressure:  [16 cmH20-24 cmH20] 24 cmH20 CT:  CT of chest from 10/10 [facility] impression 1.  No pulmonary embolus to 2. large subcarinal lymph nodes and probable right  lower lobe mass. Malignancy could present in this fashion CXR:  10/10 from [facility] persistent abnormal appearance that appears to be in the right lung base likely in the middle lobe suggestive of chronic atelectasis. Loculated effusion is not completely excluded.  ETT:  From chest x-ray on 10/10 [facility] the ET tube is at the level of the upper margin of the aortic arch and appears in good position Cytology 10/15>>>  A:   Acute  Respiratory failure related to large quantity of hemoptysis. Has h/o oxygen dependence. Unclear why. Also carries h/o PAH, unclear if this was identified by right heart cath or ECHO Hemoptysis  due to a lung neoplasm and prob c/b anticoagulation Lung mass  Extensive endobronchial tumor burden on bedside Bronchoscopy with complete obstruction of bronchus intermedius. Without any bleeding P:   RML , RLL not participating in ventilation Reduce minute ventilation for respiratory alkalosis (chronic retainer that is renally compensated). F/u on cytology closely, cytology done in [place] and another in [place]. No active bleeding, limited role embolization. Decadron continue, begin to reduce on Friday but maintain current dose for now. Transfer to [facility] for prob XRT, will consult rad onc 10/17 and informed of patient's location, apt at 1 PM and radiation treatment at 1530. Chest x-ray in a.m. WUA, wean, its possible main need for ETT was bleeding and may have a chance at extubation over next 48  hrs? But would like to coordinate with rad onc first.  CARDIOVASCULAR  Lab 08/17/12 0252  TROPONINI <0.30  LATICACIDVEN --  PROBNP --   ECG:  Sinus rhythm Lines: Triple-lumen catheter right IJ 10/10 from [facility] ECHO:  Echo performed at [facility] 10/11 LV systolic function is normal EF is greater than 55% right ventricle is moderately dilated. The right atrium is moderately dilated.  A:    H/o diastolic dysfxn pulmonary hypertension Atrial fib Huston Foley 10/16, asymptomatic P:  Dc dig, dig level <0.3. No anticoagulation in setting of bleeding lung lesion. Need to re eval pulm pressures on echo report (done and pending).  RENAL  Lab 08/17/12 0230 08/16/12 1532  NA 138 137  K 3.6 3.4*  CL 98 95*  CO2 35* 38*  BUN 34* 34*  CREATININE 0.56 0.63  CALCIUM 8.5 8.8  MG -- 2.8*  PHOS -- 3.8   Intake/Output      10/15 0701 - 10/16 0700 10/16 0701 - 10/17 0700   I.V. (mL/kg) 1262.2 (14) 197.4 (2.2)   IV Piggyback 10    Total Intake(mL/kg) 1272.2 (14.1) 197.4 (2.2)   Urine (mL/kg/hr) 670 (0.3)    Total Output 670    Net +602.2 +197.4         Foley:  From [facility] 10/10  A:  Pos balance P:   Chem in am  KVO IVF Place OGT and start diet.  GASTROINTESTINAL  Lab 08/16/12 1532  AST 56*  ALT 101*  ALKPHOS 100  BILITOT 0.4  PROT 7.6  ALBUMIN 3.1*    A:  Weight loss >50-60 lbs unintentional one year  P:   Orogastric tube in place Nutrition to start TF. PPI  HEMATOLOGIC  Lab 08/17/12 0824 08/17/12 0230 08/16/12 1532  HGB 13.4 13.3 13.5  HCT 42.3 41.8 43.0  PLT 168 163 179  INR -- -- 1.10  APTT -- -- 27   A:   Hemoptysis probably due to to pulmonary malignancy  hemoglobin and hematocrit from [facility] 10/10 12.8/39.4 Likely lung ca P:  SCD When able from a hemoptysis standpoint will add Lovenox to reduce dvt in setting likely cancer, hope to be able to start SQ heparin on 10/18 after first treatment of radiation. Bronch without major bleeding now, no role embolization at this stage, monitor for more hemoptysis.  INFECTIOUS  Lab 08/17/12 0824 08/17/12 0230 08/16/12 1532  WBC 6.1 6.3 6.3  PROCALCITON -- -- 0.30   Cultures: Obtained at 10/10 [facility] >>> no growth as of 10/13 Antibiotics: None  A:  No infections noted WBC from outside labs 5.4 no signs of infection P:   Continue to  monitor CBC, WBC. Steroids. At risk post obstructive process, add zosyn if becomes febrile or WBC increases dramatically.  ENDOCRINE  Lab 08/17/12 0753 08/17/12 0400 08/16/12 2343 08/16/12 2006  GLUCAP 118* 133* 140* 126*   A:  No known problems P:   Decadron, SSI addition  NEUROLOGIC  A:  Sedated on sedative drips/propofol P:   Reassess neuro status when patient does sedation vacation at [facility] For now will remain on heavy sedation due to hemoptysis  Will need head MRI for mets after path dx.  BEST PRACTICE / DISPOSITION Level of Care:  Intensive Care Primary Service:  pccm Consultants:  Rad Onc Code Status:  Full code Diet:  N.p.o. DVT Px:  SCDs GI Px:  Protonix Skin Integrity:  Intact Social /  Family:  Family at bedside, updated. I also called [facility] and PCCm at [facility] to update onj clinical course  Ccm time 35 min   Alyson Reedy, M.D. [facility] Pulmonary/Critical Care Medicine. Pager: [phone]. After hours pager: [phone].

## 2012-08-17 NOTE — Progress Notes (Signed)
INITIAL ADULT NUTRITION ASSESSMENT Date: 08/17/2012   Time: 1:52 PM Reason for Assessment: vent, TF consult  ASSESSMENT: Female 71 y.o.  Dx: Large subcarinal lymph nodes and probable right lower lobe mass, ARF, hemoptysis  Hx:  Past Medical History  Diagnosis Date  . Chronic atrial fibrillation   . PAH (pulmonary artery hypertension)   . Oxygen dependent   . Diastolic heart failure   . HTN (hypertension)   . Anxiety   . Gastric ulcer   . CVA (cerebral infarction)   . COPD (chronic obstructive pulmonary disease)   . Stroke   . CHF (congestive heart failure)   . Lung cancer     possible cancer diagnosis  . Hyperlipidemia   . Hiatal hernia    Past Surgical History  Procedure Date  . Abdominal hysterectomy   . Cholecystectomy   . Replacement total knee bilateral   . Lumbar spur removal     Related Meds:     . albuterol  2.5 mg Nebulization Q6H  . antiseptic oral rinse  15 mL Mouth Rinse QID  . atropine      . bimatoprost  1 drop Both Eyes QHS  . chlorhexidine  15 mL Mouth Rinse BID  . dexamethasone  10 mg Intravenous Q6H  . EPINEPHrine  1 mg Intratracheal Once  . fentaNYL      . fentaNYL  300 mcg Intravenous Once  . insulin aspart  2-6 Units Subcutaneous Q4H  . pantoprazole (PROTONIX) IV  40 mg Intravenous Q12H  . vecuronium      . vecuronium  5 mg Intravenous Once  . DISCONTD: bimatoprost  1 drop Both Eyes QHS  . DISCONTD: dexamethasone  10 mg Intravenous Q6H  . DISCONTD: digoxin  0.125 mg Per Tube Daily  . DISCONTD: digoxin  0.125 mg Per NG tube Daily  . DISCONTD: pantoprazole (PROTONIX) IV  40 mg Intravenous QHS     Ht: 5\' 10"  (177.8 cm)  Wt: 198 lb 6.6 oz (90 kg)  Ideal Wt: 68.5 kg  % Ideal Wt: 131  Usual Wt: nearing 300# per family 1 year ago % Usual Wt: 66  Body mass index is 28.47 kg/(m^2).  Labs:  CMP     Component Value Date/Time   NA 138 08/17/2012 0230   K 3.6 08/17/2012 0230   CL 98 08/17/2012 0230   CO2 35* 08/17/2012 0230   GLUCOSE 125* 08/17/2012 0230   BUN 34* 08/17/2012 0230   CREATININE 0.56 08/17/2012 0230   CALCIUM 8.5 08/17/2012 0230   PROT 7.6 08/16/2012 1532   ALBUMIN 3.1* 08/16/2012 1532   AST 56* 08/16/2012 1532   ALT 101* 08/16/2012 1532   ALKPHOS 100 08/16/2012 1532   BILITOT 0.4 08/16/2012 1532   GFRNONAA >90 08/17/2012 0230   GFRAA >90 08/17/2012 0230    I/O last 3 completed shifts: In: 1272.2 [I.V.:1262.2; IV Piggyback:10] Out: 670 [Urine:670] Total I/O In: 197.4 [I.V.:197.4] Out: -    Diet Order: NPO  Supplements/Tube Feeding:  none  IVF:    sodium chloride Last Rate: 50 mL/hr at 08/17/12 0915  fentaNYL infusion INTRAVENOUS Last Rate: 100 mcg/hr (08/17/12 0800)  propofol Last Rate: 25 mcg/kg/min (08/17/12 0950)  DISCONTD: fentaNYL infusion INTRAVENOUS Last Rate: 50 mcg/hr (08/16/12 1601)  DISCONTD: propofol Last Rate: 50 mcg/kg/min (08/16/12 1447)    Estimated Nutritional Needs:   Kcal: 1616 Protein: 95-115 gm Fluid: >1.7L  Food/Nutrition Related Hx: Pt reported that patient had been eating well however had lost a lot of  weight.  Pt visited daughter in [place] for a couple of months this summer and regained 30# but then lost again after returning home.  Weight loss of up to 44% weight in the last year without trying.  Pt currently on vent. NPO.  To begin TF.  Last BM unknown.  Pt meets criteria for severe malnutrition related to chronic illness AEB weight loss and intake <75% for the last month.  Propofol currently at 5.5 ml/hour providing 145 kcal/day.  NUTRITION DIAGNOSIS: -Inadequate oral intake (NI-2.1).  Status: Ongoing  RELATED TO: inability to eat  AS EVIDENCE BY: npo status  MONITORING/EVALUATION(Goals): TF, labs, weight Goal:  Tolerate TF to meet 100% estimated needs.  EDUCATION NEEDS: -No education needs identified at this time  INTERVENTION: Begin TF via OG tube with Oxepa at 20 ml/hr.  Increase to 30 ml per hour in 12 hours if tolerating.  Add  prostat 30 ml bid once TF tolerance established.  Oxepa at 30 ml/hr plus prostat bid to provide 1425 kcal, 98 gm protein daily. Will adjust TF rate and prostat needs based on needs for propofol.    Dietitian [phone]  DOCUMENTATION CODES Per approved criteria  -Severe malnutrition in the context of chronic illness    Nimai Burbach, Anastasia Fiedler 08/17/2012, 1:52 PM

## 2012-08-17 NOTE — Progress Notes (Signed)
Radiation Oncology         [phone] ________________________________  Initial inpatient Consultation  Name: Meredith Patton MRN: [id number]  Date: 08/16/2012  DOB: 11-02-1941  REFERRING PHYSICIAN: Koren Bound, MD  DIAGNOSIS: Hemoptysis likely to to anticoagulation in the setting of endobronchial mass (suspected lung cancer)  HISTORY OF PRESENT ILLNESS::Meredith Patton is a 71 y.o. female  I was asked to evaluate for urgent radiation therapy. She is currently unresponsive and the delayed up. The majority of her history is provided by her family members as well as her medical chart. Per the family members she had about a 30 pound weight loss earlier this year when she was placed on Lasix due to congestive heart failure. Other than that she had been in her usual state of health and had not been complaining of any worsening shortness of breath weight loss or decreased appetite. In retrospect the family states that perhaps she had been eating a little bit less and being a bit more fatigue than normal but she was still caring for herself. She was placed on a new anticoagulant by her primary care physician last week. On Thursday she had an episode of hemoptysis and was brought to the emergency room by her family members. At that point she was intubated do to her hemoptysis and was transferred to [facility] for possible intervention. I was called by critical care yesterday after bronchoscopy revealed just a single oozing blood vessel and no real areas that needed embolization were quite delay she. Biopsies were taken yesterday and pathology is pending. After discussion with the critical care team they believe she could be extubated relatively quickly however they would like some protection against further hemoptysis before extubating her and asked me to evaluate her for radiation for control of hemoptysis. Per the family her last episode of hemoptysis with the day before yesterday when she was transferred  to come. They deny any history of headaches or visual changes. They deny any mobility problems or focal numbness or weakness. Workup at [facility] and revealed a 7.7 cm mass in the infrahilar region. Prominent subcarinal lymph nodes were also noted. Bilateral pleural effusions were noted. No mention was made of adrenal glands or liver. CT of the chest and head are pending at this time. Report a bronchoscopy performed yesterday showed a mass at the takeoff incompletely occluding the right middle and lower lobe bronchus.  PREVIOUS RADIATION THERAPY: No  PAST MEDICAL HISTORY:  has a past medical history of Chronic atrial fibrillation; PAH (pulmonary artery hypertension); Oxygen dependent; Diastolic heart failure; HTN (hypertension); Anxiety; Gastric ulcer; CVA (cerebral infarction); COPD (chronic obstructive pulmonary disease); Stroke; CHF (congestive heart failure); Lung cancer; Hyperlipidemia; and Hiatal hernia.    PAST SURGICAL HISTORY: Past Surgical History  Procedure Date  . Abdominal hysterectomy   . Cholecystectomy   . Replacement total knee bilateral   . Lumbar spur removal     FAMILY HISTORY: family history includes Heart disease in her father and mother.  SOCIAL HISTORY:  reports that she has been smoking Cigarettes.  She has never used smokeless tobacco. She reports that she does not drink alcohol or use illicit drugs.  ALLERGIES: Gabapentin and Morphine and related  MEDICATIONS:  Current Facility-Administered Medications  Medication Dose Route Frequency Provider Last Rate Last Dose  . 0.9 %  sodium chloride infusion  250 mL Intravenous PRN Simonne Martinet, NP      . 0.9 %  sodium chloride infusion   Intravenous Continuous  Nelda Bucks, MD 50 mL/hr at 08/17/12 0915    . albuterol (PROVENTIL) (5 MG/ML) 0.5% nebulizer solution 2.5 mg  2.5 mg Nebulization Q6H Richard C Raynor, Student-NP   2.5 mg at 08/17/12 0826  . antiseptic oral rinse (BIOTENE) solution 15 mL  15 mL Mouth Rinse QID  Merwyn Katos, MD   15 mL at 08/17/12 1200  . atropine 1 MG/ML injection           . bimatoprost (LUMIGAN) 0.01 % ophthalmic solution 1 drop  1 drop Both Eyes QHS Merwyn Katos, MD   1 drop at 08/16/12 2300  . chlorhexidine (PERIDEX) 0.12 % solution 15 mL  15 mL Mouth Rinse BID Merwyn Katos, MD   15 mL at 08/17/12 0915  . dexamethasone (DECADRON) injection 10 mg  10 mg Intravenous Q6H Merwyn Katos, MD   10 mg at 08/17/12 0504  . fentaNYL (SUBLIMAZE) 0.05 MG/ML injection           . fentaNYL (SUBLIMAZE) 10 mcg/mL in sodium chloride 0.9 % 250 mL infusion  50-300 mcg/hr Intravenous Titrated Simonne Martinet, NP 10 mL/hr at 08/17/12 0800 100 mcg/hr at 08/17/12 0800   And  . fentaNYL (SUBLIMAZE) bolus via infusion 50-100 mcg  50-100 mcg Intravenous Q6H PRN Simonne Martinet, NP      . fentaNYL (SUBLIMAZE) injection 300 mcg  300 mcg Intravenous Once Nelda Bucks, MD   300 mcg at 08/16/12 1818  . insulin aspart (novoLOG) injection 2-6 Units  2-6 Units Subcutaneous Q4H Richard C Raynor, Student-NP   2 Units at 08/17/12 1230  . iohexol (OMNIPAQUE) 300 MG/ML solution 100 mL  100 mL Intravenous Once PRN Medication Radiologist, MD   100 mL at 08/17/12 1537  . pantoprazole (PROTONIX) injection 40 mg  40 mg Intravenous Q12H Richard C Raynor, Student-NP   40 mg at 08/17/12 1232  . propofol (DIPRIVAN) 10 mg/ml infusion  5-50 mcg/kg/min Intravenous Titrated Simonne Martinet, NP 13.7 mL/hr at 08/17/12 0950 25 mcg/kg/min at 08/17/12 0950  . vecuronium (NORCURON) 10 MG injection           . DISCONTD: bimatoprost (LUMIGAN) 0.03 % ophthalmic solution 1 drop  1 drop Both Eyes QHS Richard C Raynor, Student-NP      . DISCONTD: digoxin (LANOXIN) 0.05 MG/ML solution 0.125 mg  0.125 mg Per Tube Daily Merwyn Katos, MD   0.125 mg at 08/16/12 1951  . DISCONTD: EPINEPHrine (ADRENALIN) 0.1 MG/ML injection             REVIEW OF SYSTEMS:  A 15 point review of systems is documented in the electronic medical record.  This was obtained by the nursing staff. However, I reviewed this with the patient to discuss relevant findings and make appropriate changes.  Pertinent items are noted in HPI.   PHYSICAL EXAM:  height is 5\' 10"  (1.778 m) and weight is 198 lb 6.6 oz (90 kg). Her oral temperature is 97.8 F (36.6 C). Her blood pressure is 89/57 and her pulse is 53. Her respiration is 16 and oxygen saturation is 96%.   . She is intubated in no distress. She is unresponsive. Per the family she is responsive when she was taken off of her sedation. She has decreased breath sounds anteriorly. These are particularly decreased on the right. She is in atrial fibrillation.  LABORATORY DATA:  Lab Results  Component Value Date   WBC 6.1 08/17/2012   HGB 13.4 08/17/2012  HCT 42.3 08/17/2012   MCV 81.3 08/17/2012   PLT 168 08/17/2012   Lab Results  Component Value Date   NA 138 08/17/2012   K 3.6 08/17/2012   CL 98 08/17/2012   CO2 35* 08/17/2012   Lab Results  Component Value Date   ALT 101* 08/16/2012   AST 56* 08/16/2012   ALKPHOS 100 08/16/2012   BILITOT 0.4 08/16/2012     RADIOGRAPHY: Ct Head W Wo Contrast  08/17/2012  *RADIOLOGY REPORT*  Clinical Data:  Chronic atrial fibrillation and hypertension.  Lung mass.  CT HEAD WITHOUT AND WITH CONTRAST  Technique: Contiguous axial images were obtained from the base of the skull through the vertex without and with intravenous contrast  Contrast: OMNIPAQUE IOHEXOL 300 MG/ML  SOLN  Comparison:   None.  Findings:  There is no evidence for acute infarction, intracranial hemorrhage, mass lesion, hydrocephalus, or extra-axial fluid.  Mild age related atrophy.  Slight chronic microvascular ischemic change. Following the administration of contrast, there is no abnormal enhancement of the brain or meninges.  The major intracranial vascular structures grossly patent.  Vascular calcification is noted in the carotid siphon region.  There are no destructive osseous lesions.   No acute sinus or mastoid fluid.  IMPRESSION: Unremarkable CT brain without and with contrast. Mild age related changes.  No CT evidence for intracranial metastatic disease.   Original Report Authenticated By: Elsie Stain, M.D.    Ct Chest W Contrast  08/17/2012  *RADIOLOGY REPORT*  Clinical Data: Staging lung mass.  Hemoptysis.  Respiratory distress.  Pain, cough, shortness of breath and weight loss.  CT CHEST WITH CONTRAST  Technique:  Multidetector CT imaging of the chest was performed following the standard protocol during bolus administration of intravenous contrast.  Contrast: OMNIPAQUE IOHEXOL 300 MG/ML  SOLN  Comparison: None.  Findings: Sub-centimeter thyroid nodule(s) noted, too small to characterize, but most likely benign in the absence of known clinical risk factors for thyroid carcinoma.  Endotracheal tube is in satisfactory position.  No pathologically enlarged mediastinal, hilar or axillary lymph nodes.  Atherosclerotic calcification of the arterial vasculature including coronary arteries.  Pulmonary arteries are enlarged, as is the heart.  No pericardial effusion.  There is a centrally obstructing mass in the right subcarinal region, measuring approximately 3.8 x 5.1 cm.  It obstructs the bronchus intermedius with resultant heterogeneous collapse of the right middle and right lower lobes.  Small right pleural effusion with slight hyperattenuation of the parietal pleura.  No definite pleural nodularity.  Lungs are severely emphysematous. Collapse/consolidation is mild in the left lower lobe.  Incidental imaging of the upper abdomen shows a faint blush of hyper attenuation in the peripheral right hepatic lobe, which may representing a perfusion anomaly.  Mild intrahepatic and extrahepatic biliary ductal prominence may relate to cholecystectomy.  A 4.6 cm low attenuation lesion in the right kidney is incompletely imaged.  Adrenal glands are grossly unremarkable.  Postoperative changes at  the gastroesophageal junction.  No worrisome lytic or sclerotic lesions.  Degenerative changes are seen in the spine.  Probable bone islands are seen in upper thoracic vertebral bodies.  IMPRESSION:  1.  Centrally obstructing mass in the superior segment right lower lobe with postobstructive collapse of the right middle and right lower lobes, and a small right pleural effusion. Findings are most consistent with primary bronchogenic carcinoma. 2.  Mild collapse/consolidation in the left lower lobe. 3.  Pulmonary arterial hypertension.   Original Report Authenticated By: Juliette Alcide  A. Fredirick Lathe, M.D.    Dg Chest Port 1 View  08/16/2012  *RADIOLOGY REPORT*  Clinical Data: Status post bronchoscopy.  PORTABLE CHEST - 1 VIEW  Comparison: None.  Findings: 1622 hours.  Endotracheal tube tip is 4.4 cm above the base of the carina.  Right IJ central line tip projects at the distal SVC level. The cardiopericardial silhouette is enlarged. Asymmetric elevation of the right hemidiaphragm noted with probable associated component of right middle lobe collapse / consolidation. There is a small right pleural effusion. Interstitial markings are diffusely coarsened with chronic features. Telemetry leads overlie the chest.  IMPRESSION: Support apparatus as described.  Cardiomegaly with probable asymmetric elevation of the right hemidiaphragm and some associated right middle lobe collapse / consolidation.  Small right pleural effusion noted.   Original Report Authenticated By: ERIC A. MANSELL, M.D.       IMPRESSION: 71 year old female with likely newly diagnosed non-small cell lung cancer of the right middle lobe  PLAN: I discussed with the family palliative radiation. We discussed a single fraction of radiation to help prevent any further bleeding. We discussed this would allow her to be extubated and possibly discharged. At that point she could be fully staged with a PET/CT scan and a plan regarding definitive treatment of her  likely lung cancer couldn't be determined. Due to her critical state I have simulated her today but we will start treatment tomorrow. I will treat her with 8 gray in one fraction. I do not feel this will cause her any acute side effects other than some possible esophagitis which likely she will have due to her prolonged intubation anyway. I feel this will protect her against hemoptysis while still leaving the door open for further radiation in the definitive setting if ultimately she was found to be nonmetastatic. We will followup on her pathology tomorrow. Also followup on her imaging. I recommended consultation with medical oncology once her pathology is back. I spent 60 minutes  face to face with the patient and more than 50% of that time was spent in counseling and/or coordination of care.   ------------------------------------------------  Lurline Hare, MD

## 2012-08-17 NOTE — Progress Notes (Signed)
Pt bradycardic as low as HR of 33. Got EKG and notified MD. No new orders at this time. Will continue to monitor.

## 2012-08-17 NOTE — Progress Notes (Signed)
CARE MANAGEMENT NOTE 08/17/2012  Patient:  Meredith Patton, Meredith Patton   Account Number:  [id number]  Date Initiated:  08/16/2012  Documentation initiated by:  Junius Creamer  Subjective/Objective Assessment:   hemoptysis     Action/Plan:   lives alone, pcp dr Tinnie Gens [place]   Anticipated DC Date:  08/20/2012   Anticipated DC Plan:  HOME/SELF CARE      DC Planning Services  CM consult      Choice offered to / List presented to:             Status of service:  In process, will continue to follow Medicare Important Message given?   (If response is "NO", the following Medicare IM given date fields will be blank) Date Medicare IM given:   Date Additional Medicare IM given:    Discharge Disposition:    Per UR Regulation:  Reviewed for med. necessity/level of care/duration of stay  If discussed at Long Length of Stay Meetings, dates discussed:    Comments:  [id number]/Quinn Bartling,RN,BSN,CCM: Received patient via e-link from [facility] for R.T. due to lung mass. Patient on vent, tolerated transport well. 10/15 16:38p debbie dowell rn,bsn [phone]

## 2012-08-18 ENCOUNTER — Ambulatory Visit
Admit: 2012-08-18 | Discharge: 2012-08-18 | Disposition: A | Discharge status: Home / Self Care | Payer: Medicare Other | Attending: Radiation Oncology | Admitting: Radiation Oncology

## 2012-08-18 ENCOUNTER — Inpatient Hospital Stay (HOSPITAL_COMMUNITY): Payer: Medicare Other

## 2012-08-18 ENCOUNTER — Encounter: Payer: Self-pay | Admitting: Radiation Oncology

## 2012-08-18 ENCOUNTER — Encounter (HOSPITAL_COMMUNITY): Payer: Self-pay

## 2012-08-18 LAB — CBC
HCT: 42.4 % (ref 36.0–46.0)
MCHC: 31.4 g/dL (ref 30.0–36.0)
RDW: 18 % — ABNORMAL HIGH (ref 11.5–15.5)

## 2012-08-18 LAB — PHOSPHORUS: Phosphorus: 2.9 mg/dL (ref 2.3–4.6)

## 2012-08-18 LAB — GLUCOSE, CAPILLARY
Glucose-Capillary: 131 mg/dL — ABNORMAL HIGH (ref 70–99)
Glucose-Capillary: 133 mg/dL — ABNORMAL HIGH (ref 70–99)
Glucose-Capillary: 152 mg/dL — ABNORMAL HIGH (ref 70–99)

## 2012-08-18 LAB — BLOOD GAS, ARTERIAL
Drawn by: 336861
MECHVT: 480 mL
TCO2: 28.8 mmol/L (ref 0–100)
pCO2 arterial: 43.2 mmHg (ref 35.0–45.0)
pH, Arterial: 7.498 — ABNORMAL HIGH (ref 7.350–7.450)

## 2012-08-18 LAB — COMPREHENSIVE METABOLIC PANEL
ALT: 57 U/L — ABNORMAL HIGH (ref 0–35)
Alkaline Phosphatase: 97 U/L (ref 39–117)
CO2: 33 mEq/L — ABNORMAL HIGH (ref 19–32)
Chloride: 101 mEq/L (ref 96–112)
GFR calc Af Amer: >90 mL/min (ref >90)
GFR calc non Af Amer: >90 mL/min (ref >90)
Glucose, Bld: 130 mg/dL — ABNORMAL HIGH (ref 70–99)
Potassium: 3.4 mEq/L — ABNORMAL LOW (ref 3.5–5.1)
Sodium: 141 mEq/L (ref 135–145)
Total Protein: 7.2 g/dL (ref 6.0–8.3)

## 2012-08-18 MED ORDER — POTASSIUM CHLORIDE 20 MEQ/15ML (10%) PO LIQD
40.0000 meq | Freq: Three times a day (TID) | ORAL | Status: AC
  Administered 2012-08-18 (×2): 40 meq
  Filled 2012-08-18 (×2): qty 30

## 2012-08-18 MED ORDER — FUROSEMIDE 10 MG/ML IJ SOLN
20.0000 mg | Freq: Three times a day (TID) | INTRAMUSCULAR | Status: AC
  Administered 2012-08-18 (×2): 20 mg via INTRAVENOUS
  Filled 2012-08-18 (×2): qty 2

## 2012-08-18 NOTE — Progress Notes (Signed)
[facility] Radiation Oncology Simulation and Treatment Planning Note   Name: Meredith Patton MRN: [id number]  Date: 08/18/2012  DOB: [birth year]  Status: inpatient    DIAGNOSIS: Right lung cancer    SIDE: right   CONSENT VERIFIED: yes   SET UP AND IMMOBILIZATION: Patient is setup supine with arms in a wing board.   NARRATIVE: The patient was brought to the CT Simulation planning suite.  Identity was confirmed.  All relevant records and images related to the planned course of therapy were reviewed.  Then, the patient was positioned in a stable reproducible clinical set-up for radiation therapy.  CT images were obtained.  Skin markings were placed.  The CT images were loaded into the planning software where the target and avoidance structures were contoured.  The radiation prescription was entered and confirmed.   TREATMENT PLANNING NOTE:  Treatment planning then occurred. I have requested 3D simulation with [facility] of the spinal cord, total lungs and gross tumor volume. I have also requested mlcs and an isodose plan.

## 2012-08-18 NOTE — Clinical Documentation Improvement (Signed)
MALNUTRITION DOCUMENTATION CLARIFICATION  THIS DOCUMENT IS NOT A PERMANENT PART OF THE MEDICAL RECORD  Please update your documentation within the medical record to reflect your response to this query.                                                                                         08/18/12   Dr. Molli Knock and/or Associates,  In a better effort to capture your patient's severity of illness, reflect appropriate length of stay and utilization of resources, a review of the patient medical record has revealed the following indicators:  A: Weight loss  >50-60 lbs unintentional one year  P:  Orogastric tube in place  Nutrition to start TF.  PPI Alyson Reedy, M.D. Progress Note  08/18/12               INITIAL ADULT NUTRITION ASSESSMENT Date: 08/17/2012 Time: 1:52 PM Reason for Assessment: vent, TF consult Pt meets criteria for severe malnutrition related to chronic illness AEB weight loss and intake <75% for the last month. Body mass index is 28.47 kg/(m^2). Food/Nutrition Related Hx: Pt reported that patient had been eating well however had lost a lot of weight. Pt visited daughter in [place] for a couple of months this summer and regained 30# but then lost again after returning home. Weight loss of up to 44% weight in the last year without trying.  NUTRITION DIAGNOSIS:  -Inadequate oral intake (NI-2.1). Status: Ongoing INTERVENTION:  Begin TF via OG tube with Oxepa at 20 ml/hr. Increase to 30 ml per hour in 12 hours if tolerating. Add prostat 30 ml bid once TF tolerance established. Oxepa at 30 ml/hr plus prostat bid to provide 1425 kcal, 98 gm protein daily.  Will adjust TF rate and prostat needs based on needs for propofol.    Based on your clinical judgment, please document if a condition below provides greater specificity regarding the patient's nutritional status:   - Severe Malnutrition related to Chronic Illness   - Other type of Malnutrition   - Other  Condition   - Unable  To Clinically Determine    In responding to this query please exercise your independent judgment.    The fact that a query is asked, does not imply that any particular answer is desired or expected.   Reviewed: 08/24/12 - query never addressed - ndrgi.  Mathis Dad RN Thank You,  Jerral Ralph  RN BSN CCDS Certified Clinical Documentation Specialist: Cell   [phone]  Health Information Management    TO RESPOND TO THE THIS QUERY, FOLLOW THE INSTRUCTIONS BELOW:  1. If needed, update documentation for the patient's encounter via the notes activity.  2. Access this query again and click edit on the In [employer].  3. After updating, or not, click F2 to complete all highlighted (required) fields concerning your review. Select "additional documentation in the medical record" OR "no additional documentation provided".  4. Click Sign note button.  5. The deficiency will fall out of your In Basket *Please let us know if you are not able to complete this workflow by phone or e-mail (listed below).

## 2012-08-18 NOTE — Consult Note (Signed)
ONCOLOGY  HOSPITAL CONSULTATION NOTE  Meredith Patton                                MR#: [id number]  DOB: 1941/09/01                       CSN#: [id number]  Referring MD: Critical Care Medicine   Reason for Consult:  Non Small Cell Lung Cancer   QMV:HQIONG D Patton is a 71 y.o. female smoker from [place], [place], with multiple medical issues listed below,including A Fib on Coumadin who presented to  [facility] on 08/11/2012 with frank hemoptysis, requiring intubation due to significant bleed.CT scan on the chest on 10/10 was negative for PE, but was found to have a large mass in the infrahilar region, prominent subcarinal lymph nodes and bilateral pleural effusions.She was transferred initially to [facility] for [facility] management. Bronchoscopy on 10/15 revealed just a single oozing blood vessel and no real areas that needed embolization. The Bronchus Intermedius shows extensive tumor burden and RML/RLL collapse. Biopsies were taken with cytology consistent with NSCLC (no formal path report is available for review).  Radiation Oncology (Dr. . Michell Heinrich) evaluated the patient for radiation to control hemoptysis. Simulation took place on 10/16 with 8  Wallace Cullens in one fraction. A new CT of the chest with contrast on 08/17/2012 showed  a centrally obstructing mass in the right subcarinal region, measuring approximately 3.8 x 5.1 cm, obstructing  the bronchus intermedius with resultant heterogeneous collapse of the right middle and right lower lobes.  Small right pleural effusion with slight hyperattenuation of the parietal pleura was observed. No definite pleural nodularity.  A 4.6 cm low attenuation lesion in the right kidney  Of unknown etiology is seen.  No worrisome lytic or sclerotic lesions.  Probable bone islands are seen in upper thoracic vertebral bodies. CT of the head on 10/16 was negative for malignancy or hemorrhage. No further imaging studies are available at this time.    We were requested to evaluate the patient with recommendations regarding her new diagnosis.        PMH:  Past Medical History  Diagnosis Date  . Chronic atrial fibrillation on anticoagulation   . PAH (pulmonary artery hypertension)   . Chronic Respiratory Failure, Oxygen dependent   . Diastolic heart failure   . HTN (hypertension)   . Anxiety   . Gastric ulcer   . 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 COPD (chronic obstructive pulmonary disease)   . Stroke   . CHF (congestive heart failure)   . Lung cancer     possible cancer diagnosis  . Hyperlipidemia   . Hiatal hernia     Surgeries:  Past Surgical History  Procedure Date  . Abdominal hysterectomy   . Cholecystectomy   . Replacement total knee bilateral   . Lumbar spur removal     Allergies:  Allergies  Allergen Reactions  . Gabapentin Hives  . Morphine And Related Swelling    Medications:     . albuterol  2.5 mg Nebulization Q6H  . antiseptic oral rinse  15 mL Mouth Rinse QID  . atropine      . bimatoprost  1 drop Both Eyes QHS  . chlorhexidine  15 mL Mouth Rinse BID  . dexamethasone  10 mg Intravenous Q6H  . furosemide  20 mg Intravenous Q8H  .  insulin aspart  2-6 Units Subcutaneous Q4H  . pantoprazole (PROTONIX) IV  40 mg Intravenous Q12H  . potassium chloride  40 mEq Per Tube TID     WUJ:WJXBJY chloride, fentaNYL, iohexol  ROS: Unable to obtain, patient intubated and sedated. Per chart report, she had a 50-60 lb unintentional weight loss over the last 8 months.  Upon presentation she had expectorated 1 cup of frank blood.   Family History:    Family History  Problem Relation Age of Onset  . Heart disease Mother 73  . Heart disease Father 32    Brother died with PE in the setting of lung cancer.   Social History: She lives at home alone. Single.6 children.  She smokes one pack per day of cigarettes, no alcohol, no drug use. 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 She is Full Code.  Main contact is her daughter Meredith Patton tel number [phone]  Physical Exam    Filed Vitals:   08/18/12 0900  BP:   Pulse: 69  Temp:   Resp: 21     Filed Weights   2012-09-09 0211 09/09/12 0443 08/18/12 0000  Weight: 198 lb 6.6 oz (90 kg) 198 lb 6.6 oz (90 kg) 202 lb 9.6 oz (91.9 kg)   General:  74 -year-old AAF  in no acute distress. Thin. Intubated and sedated. HEENT: Normocephalic, atraumatic, PERRLA. Sclerae anicteric. NECK:supple. no thyromegaly, no cervical or supraclavicular adenopathy  LUNGS: Decreased breath sounds bilaterally, anteriorily, R>L No wheezing, rhonchi or rales. No axillary masses. BREASTS: not examined. CARDIOVASCULAR: regular rate and rhythm normal S1-S2, no murmur , rubs or gallops ABDOMEN: soft no apparent tenderness, bowel sounds x4. No HSM. No masses palpable.  GU/rectal: deferred. EXTREMITIES: no clubbing cyanosis or edema. No bruising or petechial rash NEURO:unable to assess, intubated and sedated  Labs:  CBC   Lab 08/18/12 0440 09/09/2012 0824 Sep 09, 2012 0230 08/16/12 1532  WBC 8.2 6.1 6.3 6.3  HGB 13.3 13.4 13.3 13.5  HCT 42.4 42.3 41.8 43.0  PLT 189 168 163 179  MCV 81.7 81.3 81.2 81.7  MCH 25.6* 25.8* 25.8* 25.7*  MCHC 31.4 31.7 31.8 31.4  RDW 18.0* 17.9* 17.8* 17.9*  LYMPHSABS -- 0.4* -- --  MONOABS -- 0.5 -- --  EOSABS -- 0.0 -- --  BASOSABS -- 0.0 -- --  BANDABS -- -- -- --     CMP    Lab 08/18/12 0440 2012-09-09 0230 08/16/12 1532  NA 141 138 137  K 3.4* 3.6 3.4*  CL 101 98 95*  CO2 33* 35* 38*  GLUCOSE 130* 125* 106*  BUN 29* 34* 34*  CREATININE 0.53 0.56 0.63  CALCIUM 8.8 8.5 8.8  MG 2.9* -- 2.8*  AST 13 -- 56*  ALT 57* -- 101*  ALKPHOS 97 -- 100  BILITOT 0.7 -- 0.4        Component Value Date/Time   BILITOT 0.7 08/18/2012 0440      Lab 08/16/12 1532  INR 1.10  PROTIME --   Imaging Studies:  Ct Head W Wo Contrast  09-Sep-2012 Comparison:   None.  Findings:  There is no evidence for acute infarction,  intracranial hemorrhage, mass lesion, hydrocephalus, or extra-axial fluid.  Mild age related atrophy.  Slight chronic microvascular ischemic change. Following the administration of contrast, there is no abnormal enhancement of the brain or meninges.  The major intracranial vascular structures grossly patent.  Vascular calcification is noted in the carotid siphon region.  There are no destructive osseous lesions.  No acute sinus or mastoid fluid.  IMPRESSION: Unremarkable CT brain without and with contrast. Mild age related changes.  No CT evidence for intracranial metastatic disease.   Original Report Authenticated By: Elsie Stain, M.D.    Ct Chest W Contrast  08/17/2012  Comparison: None.  Findings: Sub-centimeter thyroid nodule(s) noted, too small to characterize, but most likely benign in the absence of known clinical risk factors for thyroid carcinoma.  Endotracheal tube is in satisfactory position.  No pathologically enlarged mediastinal, hilar or axillary lymph nodes.  Atherosclerotic calcification of the arterial vasculature including coronary arteries.  Pulmonary arteries are enlarged, as is the heart.  No pericardial effusion.  There is a centrally obstructing mass in the right subcarinal region, measuring approximately 3.8 x 5.1 cm.  It obstructs the bronchus intermedius with resultant heterogeneous collapse of the right middle and right lower lobes.  Small right pleural effusion with slight hyperattenuation of the parietal pleura.  No definite pleural nodularity.  Lungs are severely emphysematous. Collapse/consolidation is mild in the left lower lobe.  Incidental imaging of the upper abdomen shows a faint blush of hyper attenuation in the peripheral right hepatic lobe, which may representing a perfusion anomaly.  Mild intrahepatic and extrahepatic biliary ductal prominence may relate to cholecystectomy.  A 4.6 cm low attenuation lesion in the right kidney is incompletely imaged.  Adrenal glands are  grossly unremarkable.  Postoperative changes at the gastroesophageal junction.  No worrisome lytic or sclerotic lesions.  Degenerative changes are seen in the spine.  Probable bone islands are seen in upper thoracic vertebral bodies.  IMPRESSION:  1.  Centrally obstructing mass in the superior segment right lower lobe with postobstructive collapse of the right middle and right lower lobes, and a small right pleural effusion. Findings are most consistent with primary bronchogenic carcinoma. 2.  Mild collapse/consolidation in the left lower lobe. 3.  Pulmonary arterial hypertension.   Original Report Authenticated By: Reyes Ivan, M.D.    Dg Chest Port 1 View  08/18/2012  *RADIOLOGY REPORT*  Clinical Data: Endotracheal tube placement, pleural effusions  PORTABLE CHEST - 1 VIEW  Comparison: 08/16/2012; chest CT - 08/17/2012  Findings:  Grossly unchanged cardiac silhouette and mediastinal contours with obscuration of the right heart border secondary to the right lower and mid lung consolidation.  There is persistent right-sided volume loss with deviation of the cardiomediastinal structures into the right hemithorax.  Stable position of support apparatus.  No pneumothorax.  Unchanged small right-sided pleural effusion.  Left basilar opacities favored to represent atelectasis.  Unchanged bones.  IMPRESSION: 1.  Stable positioning of support apparatus.  No pneumothorax. 2.  Grossly unchanged right lower and mid lung consolidation and volume loss.   Original Report Authenticated By: Waynard Reeds, M.D.      ECHO: Echo performed at [facility] 10/11 LV systolic function is normal EF is greater than 55% right ventricle is moderately dilated. The right atrium is moderately dilated.   Bronchoscopy on 08/16/2012: For  Evaluation mass, bleeding localization  1. RUL WNL  2. Bronchus intermedius , about 1.5 cm below RUL take off, mass, multiple white caps on posterior, complete obstruction  and extrinsic compression  3. Lavage done at [facility], sent cytology, mild oozing post, 2 cc 1:1000 diluated in 9 cc, placed, no bleeding  4. Left WNL    A/P: 71 y.o. female  Transferred  On 08/16/2012 from [facility] after presenting on 08/11/2012 with abrupt onset of large  volume of hemoptysis, resulting in respiratory failure . She was found per CT of chest from 10/10 to have large subcarinal lymph nodes and probable right lower lobe mass, confirmed by repeat CT of the chest on 10/15 at [facility]. .  Bedside bronchoscopy demonstrated obstruction of the Bronchus Intermedius with extensive tumor burden and RML/RLL collapse.Cytology is indicative NSCLCa (No formal path report is available for review). She has undergone one fraction of radiation to control her bleeding with good results. Currently, she continues to be intubated to preserve airway. No active bleeding att his time.  We were kindly requested to evaluate the patient. Dr. Welton Flakes  is to see the patient following this consult with recommendations regarding diagnosis, treatment options and further workup studies, which may include molecular studies including EGFR mutation and ALK gene translocation if she has a diagnosis of non-squamous, non-small cell lung cancer, as well as possible completion of imaging studies when stable.  Thank you for allowing [place] the opportunity to participate in the care of this patient.   [facility] 08/18/2012 10:01 AM    ATTENDING'S ATTESTATION:  I personally reviewed patient's chart, examined patient myself, formulated the treatment plan as followed.    Patient with cytology consistent with NSCLC presenting with hemoptysis. She is intubated for airway protection. She will be receiving palliative radiation therapy today. I agree with this.   I also agree that we will need further staging work up including PET scan. She may eventually require chemotherapy. We will plan on this after she is  stabilized clinically and we have a definitive pathology as well staging.  I spoke with the family they understand and are in agreement with Rt today. They are hoping and praying that the CCM team can safely get her off the ventilator.  I will continue to follow the patient with you.  Drue Second, MD Medical/Oncology [facility] [phone] (beeper) [phone] (Office)  08/18/2012, 3:53 PM

## 2012-08-18 NOTE — Progress Notes (Signed)
ABG reviewed   Vent changes made

## 2012-08-18 NOTE — Progress Notes (Signed)
Name: Meredith Patton MRN: [id number] DOB: 11-30-1940    LOS: 2  CC: acute respiratory failure  PULMONARY / CRITICAL CARE MEDICINE  Brief patient description:  71 year old female presented to [facility] presented with abrupt onset of large volume of hemoptysis, resulting in respiratory failure 08/11/2012. Transferred to [facility] 08/16/2012. Bronch with likely cancer obstruction BI, collapse RML, RLL, no bleeding.  Current Status: Afib, some brady  Vital Signs: Temp:  [97.5 F (36.4 C)-99.6 F (37.6 C)] 99.6 F (37.6 C) (10/17 0403) Pulse Rate:  [48-84] 60  (10/17 0403) Resp:  [3-21] 21  (10/17 0403) BP: (89-159)/(54-94) 117/67 mmHg (10/17 0403) SpO2:  [93 %-100 %] 98 % (10/17 0403) FiO2 (%):  [39.2 %-100 %] 40.1 % (10/17 0430) Weight:  [91.9 kg (202 lb 9.6 oz)] 91.9 kg (202 lb 9.6 oz) (10/17 0000)  Physical Examination: General:   on ventilator intubated and more alert and responsive this AM. Neuro:  Arousable, following simple commands and moving ext to command. HEENT:  [place]/AT, PERRL, EOM-I and MMM. Neck:   Supple, -LAN and -thyromegally. Cardiovascular:  S1, S2 normal. No gallops no rubs no murmurs.  Lungs:   lung fields clear, reduced rt base Abdomen:   bowel sounds present, abdomen soft nontender Musculoskeletal:   no clubbing no edema no cyanosis  Skin:  intact no ulcers or lesions seen  Active Problems:  Major hemoptysis  Acute respiratory failure  Lung mass  Atrial fibrillation  Diastolic heart failure  Weight loss  HTN (hypertension)   ASSESSMENT AND PLAN  PULMONARY  Lab 08/18/12 0405 08/17/12 1115 08/17/12 0522  PHART 7.498* 7.450 7.497*  PCO2ART 43.2 51.9* 44.3  PO2ART 79.6* 69.7* 64.4*  HCO3 33.2* 35.6* 34.0*  O2SAT 95.5 92.3 91.6   Ventilator Settings: Vent Mode:  [-] PRVC FiO2 (%):  [39.2 %-100 %] 40.1 % Set Rate:  [16 bmp-20 bmp] 16 bmp Vt Set:  [420 mL-480 mL] 420 mL PEEP:  [4.8 cmH20-5 cmH20] 4.8 cmH20 Plateau  Pressure:  [15 cmH20-24 cmH20] 17 cmH20 CT:  CT of chest from 10/10 [facility] impression 1.  No pulmonary embolus to 2. large subcarinal lymph nodes and probable right lower lobe mass. Malignancy could present in this fashion CXR:  10/10 from [facility] persistent abnormal appearance that appears to be in the right lung base likely in the middle lobe suggestive of chronic atelectasis. Loculated effusion is not completely excluded.  ETT:  From chest x-ray on 10/10 [facility] the ET tube is at the level of the upper margin of the aortic arch and appears in good position Cytology 10/15>>>Non-small cell lung cancer from [employer].  A:   Acute  Respiratory failure related to large quantity of hemoptysis. Has h/o oxygen dependence. Also carries h/o PAH, unclear if this was identified by right heart cath or ECHO Hemoptysis  due to a lung neoplasm and prob c/b anticoagulation Lung mass  Extensive endobronchial tumor burden on bedside Bronchoscopy with complete obstruction of bronchus intermedius. Without any bleeding P:   RML, RLL not participating in ventilation Sedate to reduce minute ventilation for respiratory alkalosis (chronic retainer that is renally compensated) specially that she is unlikely to extubate until after radiation treatment 10/17. NSCLC on cytology from [place]. No active bleeding, limited role embolization at this point but palliative radiation would be an option, will call H/O today. Decadron continue, begin to reduce on Friday but maintain current dose for now. Chest x-ray in a.m.  Will diurese gently today and begin weaning in off vent in AM.  CARDIOVASCULAR  Lab 08/17/12 0252  TROPONINI <0.30  LATICACIDVEN --  PROBNP --   ECG:  Sinus rhythm Lines: Triple-lumen catheter right IJ 10/10 from [facility] ECHO:  Echo performed at [facility] 10/11 LV systolic function  is normal EF is greater than 55% right ventricle is moderately dilated. The right atrium is moderately dilated.  A:   H/o diastolic dysfxn pulmonary hypertension Atrial fib [place] Foley 10/16, asymptomatic P:  Dc dig, dig level <0.3 but sig brady once sedated. No anticoagulation in setting of bleeding lung lesion. Need to re eval pulm pressures on echo report (done in [place] and pending).  RENAL  Lab 08/18/12 0440 08/17/12 0230 08/16/12 1532  NA 141 138 137  K 3.4* 3.6 --  CL 101 98 95*  CO2 33* 35* 38*  BUN 29* 34* 34*  CREATININE 0.53 0.56 0.63  CALCIUM 8.8 8.5 8.8  MG 2.9* -- 2.8*  PHOS 2.9 -- 3.8   Intake/Output      10/16 0701 - 10/17 0700 10/17 0701 - 10/18 0700   I.V. (mL/kg) 1550.8 (16.9)    IV Piggyback     Total Intake(mL/kg) 1550.8 (16.9)    Urine (mL/kg/hr) 1250 (0.6)    Total Output 1250    Net +300.8           Intake/Output Summary (Last 24 hours) at 08/18/12 0759 Last data filed at 08/18/12 0600  Gross per 24 hour  Intake 1550.79 ml  Output   1250 ml  Net 300.79 ml   Foley:  From [facility] 10/10  A:  Pos balance P:   Chem in am  KVO IVF Gentle diureses today to assist with weaning. Replace K.  GASTROINTESTINAL  Lab 08/18/12 0440 08/16/12 1532  AST 13 56*  ALT 57* 101*  ALKPHOS 97 100  BILITOT 0.7 0.4  PROT 7.2 7.6  ALBUMIN 2.9* 3.1*    A:  Weight loss >50-60 lbs unintentional one year  P:   Orogastric tube in place Nutrition to start TF. PPI  HEMATOLOGIC  Lab 08/18/12 0440 08/17/12 0824 08/17/12 0230 08/16/12 1532  HGB 13.3 13.4 13.3 13.5  HCT 42.4 42.3 41.8 43.0  PLT 189 168 163 179  INR -- -- -- 1.10  APTT -- -- -- 27   A:   Hemoptysis probably due to to pulmonary malignancy  hemoglobin and hematocrit from [facility] 10/10 12.8/39.4 Likely lung ca P:  SCD Will add SQ heparin after first dose of radiation is complete (likely start on 10/18) given high risk of DVT here. Bronch without major  bleeding now, no role embolization at this stage, monitor for more hemoptysis.  INFECTIOUS  Lab 08/18/12 0440 08/17/12 0824 08/17/12 0230 08/16/12 1532  WBC 8.2 6.1 6.3 6.3  PROCALCITON -- -- -- 0.30   Cultures: Obtained at 10/10 [facility] >>> no growth as of 10/13 Antibiotics: None  A:  No infections noted WBC from outside labs 5.4 no signs of infection P:   Continue to monitor CBC, WBC. Steroids. At risk post obstructive process, add zosyn if becomes febrile or WBC increases dramatically.  ENDOCRINE  Lab 08/18/12 0745 08/18/12 0324 08/18/12 0029 08/17/12 1927 08/17/12 1558  GLUCAP 133* 138* 131* 93 99   A:  No known problems P:   Decadron, SSI addition  NEUROLOGIC  A:  Sedated on sedative drips/propofol P:   Reassess  neuro status when patient does sedation vacation [facility] For now will remain on heavy sedation due to hemoptysis  Will need head MRI for mets after path dx (head CT with contrast is negative and exam is non-focal).  Ccm time 35 min   Alyson Reedy, M.D. [facility] Pulmonary/Critical Care Medicine. Pager: [phone]. After hours pager: [phone].

## 2012-08-19 ENCOUNTER — Inpatient Hospital Stay (HOSPITAL_COMMUNITY): Payer: Medicare Other

## 2012-08-19 ENCOUNTER — Encounter (HOSPITAL_COMMUNITY): Payer: Self-pay | Admitting: *Deleted

## 2012-08-19 DIAGNOSIS — C3490 Malignant neoplasm of unspecified part of unspecified bronchus or lung: Secondary | ICD-10-CM | POA: Diagnosis present

## 2012-08-19 DIAGNOSIS — J9811 Atelectasis: Secondary | ICD-10-CM | POA: Diagnosis present

## 2012-08-19 DIAGNOSIS — J9819 Other pulmonary collapse: Secondary | ICD-10-CM

## 2012-08-19 LAB — CBC
MCH: 25.4 pg — ABNORMAL LOW (ref 26.0–34.0)
MCHC: 30.5 g/dL (ref 30.0–36.0)
MCV: 83.3 fL (ref 78.0–100.0)
Platelets: 182 10*3/uL (ref 150–400)
RDW: 18.2 % — ABNORMAL HIGH (ref 11.5–15.5)

## 2012-08-19 LAB — BASIC METABOLIC PANEL
Calcium: 8.9 mg/dL (ref 8.4–10.5)
Creatinine, Ser: 0.56 mg/dL (ref 0.50–1.10)
GFR calc non Af Amer: >90 mL/min (ref >90)
Sodium: 144 mEq/L (ref 135–145)

## 2012-08-19 LAB — BLOOD GAS, ARTERIAL
Bicarbonate: 34.7 mEq/L — ABNORMAL HIGH (ref 20.0–24.0)
PEEP: 5 cmH2O
Patient temperature: 98.2
TCO2: 30.8 mmol/L (ref 0–100)
pCO2 arterial: 55 mmHg — ABNORMAL HIGH (ref 35.0–45.0)
pH, Arterial: 7.415 (ref 7.350–7.450)

## 2012-08-19 LAB — GLUCOSE, CAPILLARY: Glucose-Capillary: 122 mg/dL — ABNORMAL HIGH (ref 70–99)

## 2012-08-19 LAB — MAGNESIUM: Magnesium: 2.9 mg/dL — ABNORMAL HIGH (ref 1.5–2.5)

## 2012-08-19 LAB — PHOSPHORUS: Phosphorus: 4 mg/dL (ref 2.3–4.6)

## 2012-08-19 MED ORDER — CEFTAZIDIME 1 G IJ SOLR: 1.0000 g | Freq: Three times a day (TID) | INTRAMUSCULAR | Status: DC

## 2012-08-19 MED ORDER — CEFTAZIDIME 1 G IJ SOLR
1.0000 g | Freq: Three times a day (TID) | INTRAMUSCULAR | Status: DC
  Administered 2012-08-20: 1 g via INTRAVENOUS
  Filled 2012-08-19 (×3): qty 1

## 2012-08-19 MED ORDER — HEPARIN SODIUM (PORCINE) 5000 UNIT/ML IJ SOLN
5000.0000 [IU] | Freq: Three times a day (TID) | INTRAMUSCULAR | Status: DC
  Administered 2012-08-19 – 2012-08-25 (×19): 5000 [IU] via SUBCUTANEOUS
  Filled 2012-08-19 (×21): qty 1

## 2012-08-19 NOTE — Progress Notes (Signed)
Name: Meredith Patton MRN: [id number] DOB: 1941-01-08    LOS: 3  CC: acute respiratory failure  PULMONARY / CRITICAL CARE MEDICINE  Brief patient description:  71 year old female presented to [facility] presented with abrupt onset of large volume of hemoptysis, resulting in respiratory failure 08/11/2012. Transferred to [facility] 08/16/2012. Bronch with likely cancer obstruction BI, collapse RML, RLL, no bleeding.  Current Status: Stable  Vital Signs: Temp:  [97.6 F (36.4 C)-99.7 F (37.6 C)] 98.2 F (36.8 C) (10/18 0400) Pulse Rate:  [50-82] 59  (10/18 0600) Resp:  [16-30] 16  (10/18 0600) BP: (100-163)/(48-81) 109/60 mmHg (10/18 0600) SpO2:  [93 %-99 %] 97 % (10/18 0600) FiO2 (%):  [39.8 %-40.5 %] 40 % (10/18 0828) Weight:  [91 kg (200 lb 9.9 oz)] 91 kg (200 lb 9.9 oz) (10/18 0500)  Physical Examination: General:   Awake and alert Neuro:  Intact HEENT:  [place]/AT, PERRL, EOM-I and MMM. Neck:   Supple, -LAN and -thyromegally. Cardiovascular:  S1, S2 normal. No gallops no rubs no murmurs. HSIR Lungs:   lung fields clear, reduced rt base Abdomen:   bowel sounds present, abdomen soft nontender Musculoskeletal:   no clubbing no edema no cyanosis  Skin:  intact no ulcers or lesions seen  Active Problems:  Major hemoptysis  Acute respiratory failure  Lung mass  Atrial fibrillation  Diastolic heart failure  Weight loss  HTN (hypertension)   ASSESSMENT AND PLAN  PULMONARY  Lab 08/19/12 0426 08/18/12 0405 08/17/12 1115 08/17/12 0522  PHART 7.415 7.498* 7.450 7.497*  PCO2ART 55.0* 43.2 51.9* 44.3  PO2ART 71.5* 79.6* 69.7* 64.4*  HCO3 34.7* 33.2* 35.6* 34.0*  O2SAT 92.6 95.5 92.3 91.6   Ventilator Settings: Vent Mode:  [-] CPAP;PSV FiO2 (%):  [39.8 %-40.5 %] 40 % Set Rate:  [16 bmp] 16 bmp Vt Set:  [420 mL] 420 mL PEEP:  [5 cmH20] 5 cmH20 Pressure Support:  [8 cmH20] 8 cmH20 Plateau Pressure:  [12 cmH20-16 cmH20] 16 cmH20 CT:  CT of chest  from 10/10 [facility] impression 1.  No pulmonary embolus to 2. large subcarinal lymph nodes and probable right lower lobe mass. Malignancy could present in this fashion CXR:  CM, RLL collapse persists  ETT:  10/10 >> 10/18 Cytology 10/15>>>Non-small cell lung cancer from [place].  A:   Acute  Respiratory failure Has h/o oxygen dependence. Also carries h/o PAH Hemoptysis  due to a lung neoplasm and prob c/b anticoagulation - none further NSCCa of lung  RML, RLL collapse due to airway obstruction: Extensive endobronchial tumor burden on bedside Bronchoscopy with complete obstruction of bronchus intermedius.   Tolerating extubation well P:   Post extubation airway hygiene Cont XRT Onc following   CARDIOVASCULAR  Lines: R IJ CVL 10/10 >>  ECHO:  Echo performed at [facility] 10/11 LV systolic function is normal EF is greater than 55% right ventricle is moderately dilated. The right atrium is moderately dilated.  A:   H/o diastolic dysfxn pulmonary hypertension Atrial fib Huston Foley 10/16, asymptomatic P:  No anticoagulation in setting of bleeding lung lesion.   RENAL  Lab 08/19/12 0530 08/18/12 0440 08/17/12 0230 08/16/12 1532  NA 144 141 138 137  K 4.0 3.4* -- --  CL 103 101 98 95*  CO2 35* 33* 35* 38*  BUN 34* 29* 34* 34*  CREATININE 0.56 0.53 0.56 0.63  CALCIUM 8.9 8.8 8.5 8.8  MG 2.9* 2.9* -- 2.8*  PHOS 4.0 2.9 --  3.8   Intake/Output      10/17 0701 - 10/18 0700 10/18 0701 - 10/19 0700   I.V. (mL/kg) 1351.9 (14.9)    Other 380    IV Piggyback 10    Total Intake(mL/kg) 1741.9 (19.1)    Urine (mL/kg/hr) 2760 (1.3)    Total Output 2760    Net -1018.1           Intake/Output Summary (Last 24 hours) at 08/19/12 0950 Last data filed at 08/19/12 0600  Gross per 24 hour  Intake 1577.9 ml  Output   2760 ml  Net -1182.1 ml    A:  Pos net balance for hospitalization P:   Cont to monitor   GASTROINTESTINAL  Lab  08/18/12 0440 08/16/12 1532  AST 13 56*  ALT 57* 101*  ALKPHOS 97 100  BILITOT 0.7 0.4  PROT 7.2 7.6  ALBUMIN 2.9* 3.1*    A:  Weight loss >50-60 lbs unintentional one year  P:   Leave NGT for now.  Address nutrition 10/19 - PO or TFs via NGT Cont PPI  HEMATOLOGIC  Lab 08/19/12 0530 08/18/12 0440 08/17/12 0824 08/17/12 0230 08/16/12 1532  HGB 13.1 13.3 13.4 13.3 13.5  HCT 43.0 42.4 42.3 41.8 43.0  PLT 182 189 168 163 179  INR -- -- -- -- 1.10  APTT -- -- -- -- 27   A:  No acute issues P:  Tolerating heparin DVT prophylaxis - cont same   INFECTIOUS  Lab 08/19/12 0530 08/18/12 0440 08/17/12 0824 08/17/12 0230 08/16/12 1532  WBC 12.9* 8.2 6.1 6.3 6.3  PROCALCITON -- -- -- -- 0.30   Cultures: Blood 10/10 >> NEG Resp 10/15 >> few Pseudomonas (sens P) >>   Antibiotics: Ceftaz 10/18 >>   A:  Purulent secretions by ETS 10/18 >> purulent bronchitis P:   Abx as above  ENDOCRINE  Lab 08/19/12 0743 08/18/12 2339 08/18/12 2040 08/18/12 1755 08/18/12 1123  GLUCAP 145* 142* 134* 152* 124*   A:  Mild steroid induced hyperglycemia. No indication for SSI  I don't think there is a steroid responsive condition here -  P:   D/C Decadron D/C SSI   NEUROLOGIC  A:  Mild confusion post extubation P:   Monitor  Brett Canales Minor ACNP Adolph Pollack PCCM Pager [phone] till 3 pm If no answer page [phone] 08/19/2012, 9:50 AM   I have interviewed and examined the patient and reviewed the database. I have formulated the assessment and plan as reflected in the note above with amendments made by me. 40 mins of direct critical care time provided  Billy Fischer, MD;  PCCM service; Mobile [phone]

## 2012-08-19 NOTE — Progress Notes (Signed)
Nutrition Follow-up  Intervention:  Diet advancement per MD.  Monitor for adequacy.  Currently asking for fried chicken.    Assessment:   Per Oncology, pt with NSCLC and hemoptysis.Marland Kitchen  She will receive palliative radiation therapy today.  Just extubated.  Feeding Tube out.  Diet Order:  NPO  Meds: Scheduled Meds:   . albuterol  2.5 mg Nebulization Q6H  . antiseptic oral rinse  15 mL Mouth Rinse QID  . bimatoprost  1 drop Both Eyes QHS  . chlorhexidine  15 mL Mouth Rinse BID  . dexamethasone  10 mg Intravenous Q6H  . furosemide  20 mg Intravenous Q8H  . insulin aspart  2-6 Units Subcutaneous Q4H  . pantoprazole (PROTONIX) IV  40 mg Intravenous Q12H  . potassium chloride  40 mEq Per Tube TID   Continuous Infusions:   . feeding supplement (OXEPA) 1,000 mL (08/17/12 2042)  . fentaNYL infusion INTRAVENOUS 75 mcg/hr (08/18/12 1415)  . propofol 20 mcg/kg/min (08/18/12 1620)   PRN Meds:.sodium chloride, fentaNYL  Labs:  CMP     Component Value Date/Time   NA 144 08/19/2012 0530   K 4.0 08/19/2012 0530   CL 103 08/19/2012 0530   CO2 35* 08/19/2012 0530   GLUCOSE 131* 08/19/2012 0530   BUN 34* 08/19/2012 0530   CREATININE 0.56 08/19/2012 0530   CALCIUM 8.9 08/19/2012 0530   PROT 7.2 08/18/2012 0440   ALBUMIN 2.9* 08/18/2012 0440   AST 13 08/18/2012 0440   ALT 57* 08/18/2012 0440   ALKPHOS 97 08/18/2012 0440   BILITOT 0.7 08/18/2012 0440   GFRNONAA >90 08/19/2012 0530   GFRAA >90 08/19/2012 0530     Intake/Output Summary (Last 24 hours) at 08/19/12 1013 Last data filed at 08/19/12 0600  Gross per 24 hour  Intake 1486.9 ml  Output   2760 ml  Net -1273.1 ml    Weight Status:  91 kg-stable Temp Readings from Last 3 Encounters:  08/19/12 98.2 F (36.8 C) Oral   Re-estimated needs:  1800-1900 kcal, 95-115 gm protein  Nutrition Dx:  Inadequate oral intake (NI-2.1). Status: Ongoing  Goal:  Tolerate TF to meet 100% estimated needs.  Monitor:  TF , weight, labs,  i/o   Oran Rein, RD, LDN Clinical Inpatient Dietitian Pager:  [phone] Weekend and after hours pager:  [phone]

## 2012-08-19 NOTE — Progress Notes (Signed)
Pt extubated at 1000 to 3L [place], hr 82, stas 95..productive cough, BBS rhonchi, no stridor. RT will continue to monitor.

## 2012-08-20 ENCOUNTER — Inpatient Hospital Stay (HOSPITAL_COMMUNITY): Payer: Medicare Other

## 2012-08-20 LAB — CBC
Hemoglobin: 12 g/dL (ref 12.0–15.0)
MCH: 25.3 pg — ABNORMAL LOW (ref 26.0–34.0)
MCHC: 30.5 g/dL (ref 30.0–36.0)
RDW: 18.4 % — ABNORMAL HIGH (ref 11.5–15.5)

## 2012-08-20 LAB — GLUCOSE, CAPILLARY
Glucose-Capillary: 108 mg/dL — ABNORMAL HIGH (ref 70–99)
Glucose-Capillary: 119 mg/dL — ABNORMAL HIGH (ref 70–99)

## 2012-08-20 LAB — BASIC METABOLIC PANEL
BUN: 28 mg/dL — ABNORMAL HIGH (ref 6–23)
Calcium: 8.2 mg/dL — ABNORMAL LOW (ref 8.4–10.5)
GFR calc Af Amer: >90 mL/min (ref >90)
GFR calc non Af Amer: >90 mL/min (ref >90)
Glucose, Bld: 110 mg/dL — ABNORMAL HIGH (ref 70–99)
Potassium: 3.3 mEq/L — ABNORMAL LOW (ref 3.5–5.1)
Sodium: 143 mEq/L (ref 135–145)

## 2012-08-20 LAB — CULTURE, RESPIRATORY W GRAM STAIN

## 2012-08-20 MED ORDER — METOPROLOL TARTRATE 1 MG/ML IV SOLN: 3.0000 mg | Freq: Once | INTRAVENOUS | Status: AC | PRN

## 2012-08-20 MED ORDER — CIPROFLOXACIN HCL 750 MG PO TABS
750.0000 mg | ORAL_TABLET | Freq: Two times a day (BID) | ORAL | Status: DC
  Administered 2012-08-20 – 2012-08-25 (×11): 750 mg via ORAL
  Filled 2012-08-20 (×15): qty 1

## 2012-08-20 MED ORDER — POTASSIUM CHLORIDE CRYS ER 20 MEQ PO TBCR
40.0000 meq | EXTENDED_RELEASE_TABLET | Freq: Once | ORAL | Status: AC
  Administered 2012-08-20: 40 meq via ORAL
  Filled 2012-08-20: qty 2

## 2012-08-20 MED ORDER — ALPRAZOLAM ER 0.5 MG PO TB24
0.5000 mg | ORAL_TABLET | Freq: Three times a day (TID) | ORAL | Status: DC | PRN
  Administered 2012-08-20 – 2012-08-25 (×7): 0.5 mg via ORAL
  Filled 2012-08-20 (×8): qty 1

## 2012-08-20 MED ORDER — OXYCODONE HCL 5 MG/5ML PO SOLN
5.0000 mg | Freq: Four times a day (QID) | ORAL | Status: DC | PRN
  Administered 2012-08-20 – 2012-08-21 (×2): 5 mg via ORAL
  Filled 2012-08-20 (×2): qty 5

## 2012-08-20 NOTE — Progress Notes (Signed)
Name: Meredith Patton MRN: [id number] DOB: 06-27-1941    LOS: 4 Date of admit 08/16/2012   CC: acute respiratory failure  PULMONARY / CRITICAL CARE MEDICINE  Brief patient description:  71 year old female presented to [facility] presented with abrupt onset of large volume of hemoptysis, resulting in respiratory failure 08/11/2012. CT 08/02/12 at [facility]: 1.  No pulmonary embolus to 2. large subcarinal lymph nodes and probable right lower lobe mass. Malignancy could present in this fashion./ Bronch washing at [facility] 08/12/12 - Malignant cells c/w NSCLC.  Transferred to [facility] 08/16/2012. Bronch with likely cancer obstruction BI, collapse RML, RLL, no bleeding.    DEVICES  ETT:  10/10 >> 10/18  EVENTS  Bronch 08/16/12 at [facility]  - Bronchoscopy on 08/16/2012: For Evaluation mass, bleeding localization  1. RUL WNL  2. Bronchus intermedius , about 1.5 cm below RUL take off, mass, multiple white caps on posterior, complete obstruction and extrinsic compression  3. Lavage done at [facility], sent cytology, mild oozing post, 2 cc 1:1000 diluated in 9 cc, placed, no bleeding  4. Left WNL   10/16  CT chest centrally obstructing mass in the right subcarinal region, measuring approximately 3.8 x 5.1 cm, obstructing the bronchus intermedius with resultant heterogeneous collapse of the right middle and right lower lobes. Centrally obstructing mass in the superior segment right lower lobe with postobstructive collapse of the right middle and right lower lobes, and a small right pleural effusion. Findings are most consistent with primary bronchogenic carcinoma. Ct head neg for malignancy  08/17/12 :  Simulation took place on 10/16 with 8 Gray in one fraction  08/18/12 - s/p XRT  08/19/12 - extubated   SUBJECTIVE/OVERNIGHT/INTERVAL HX 08/20/12: No overnight events.Demanding t go home. CAm-ICU neg for delirium. Feels hospital bed is like being in a 20 feet ditch. Says family will take  care of her. Expresses desire to return to hospital if has setbacks at home   Vital Signs: Temp:  [97.5 F (36.4 C)-99.2 F (37.3 C)] 98.2 F (36.8 C) (10/19 0400) Pulse Rate:  [54-93] 90  (10/19 0600) Resp:  [14-23] 17  (10/19 0600) BP: (110-158)/(55-103) 129/67 mmHg (10/19 0600) SpO2:  [92 %-98 %] 98 % (10/19 0803) FiO2 (%):  [40.1 %] 40.1 % (10/18 1000)  Physical Examination: General:   Awake and alert. Looks chronic unwell Neuro:  Intact. RASS 0 to +1. CAm-ICU neg for delirum HEENT:  [place]/AT, PERRL, EOM-I and MMM. Neck:   Supple, -LAN and -thyromegally. Cardiovascular:  S1, S2 normal. No gallops no rubs no murmurs. HSIR Lungs:   lung fields clear, reduced rt base Abdomen:   bowel sounds present, abdomen soft nontender Musculoskeletal:   no clubbing no edema no cyanosis  Skin:  intact no ulcers or lesions seen  Active Problems:  Major hemoptysis  Acute respiratory failure  Lung mass  Atrial fibrillation  Diastolic heart failure  Weight loss  HTN (hypertension)  Cancer of bronchus and lung  Atelectasis of right lung   ASSESSMENT AND PLAN  PULMONARY  Lab 08/19/12 0426 08/18/12 0405 08/17/12 1115 08/17/12 0522  PHART 7.415 7.498* 7.450 7.497*  PCO2ART 55.0* 43.2 51.9* 44.3  PO2ART 71.5* 79.6* 69.7* 64.4*  HCO3 34.7* 33.2* 35.6* 34.0*  O2SAT 92.6 95.5 92.3 91.6   Ventilator Settings: Vent Mode:  [-]  FiO2 (%):  [40.1 %] 40.1 %    A:   Acute  Respiratory failure Has h/o oxygen dependence. Also carries h/o PAH Hemoptysis  due to a  lung neoplasm and prob c/b anticoagulation - none further NSCCa of lung  RML, RLL collapse due to airway obstruction: Extensive endobronchial tumor burden on bedside Bronchoscopy with complete obstruction of bronchus intermedius.    - on 08/20/12: Tolerating extubation well. No respiratory distress. Demanding to go home  P:   Post extubation airway hygiene Cont XRT as directed per Rad onc Onc  following   CARDIOVASCULAR  Lines: R IJ CVL 10/10 >>  ECHO:  Echo performed at [facility] 10/11 LV systolic function is normal EF is greater than 55% right ventricle is moderately dilated. The right atrium is moderately dilated.  A:   H/o diastolic dysfxn pulmonary hypertension Atrial fib Huston Foley 10/16, asymptomatic P:  No anticoagulation in setting of bleeding lung lesion.   RENAL  Lab 08/20/12 0415 08/19/12 0530 08/18/12 0440 08/17/12 0230 08/16/12 1532  NA 143 144 141 138 137  K 3.3* 4.0 -- -- --  CL 105 103 101 98 95*  CO2 32 35* 33* 35* 38*  BUN 28* 34* 29* 34* 34*  CREATININE 0.49* 0.56 0.53 0.56 0.63  CALCIUM 8.2* 8.9 8.8 8.5 8.8  MG -- 2.9* 2.9* -- 2.8*  PHOS -- 4.0 2.9 -- 3.8   Intake/Output      10/18 0701 - 10/19 0700 10/19 0701 - 10/20 0700   I.V. (mL/kg) 520 (5.7)    Other     IV Piggyback 60    Total Intake(mL/kg) 580 (6.4)    Urine (mL/kg/hr) 1115 (0.5)    Total Output 1115    Net -535           Intake/Output Summary (Last 24 hours) at 08/20/12 0833 Last data filed at 08/20/12 0600  Gross per 24 hour  Intake    540 ml  Output   1115 ml  Net   -575 ml    A:  Mild hypokalemia P:   Replete K   GASTROINTESTINAL  Lab 08/18/12 0440 08/16/12 1532  AST 13 56*  ALT 57* 101*  ALKPHOS 97 100  BILITOT 0.7 0.4  PROT 7.2 7.6  ALBUMIN 2.9* 3.1*    A:  Weight loss >50-60 lbs unintentional one year  P:   Advance diet as tolerated Continue ppi  HEMATOLOGIC  Lab 08/20/12 0415 08/19/12 0530 08/18/12 0440 08/17/12 0824 08/17/12 0230 08/16/12 1532  HGB 12.0 13.1 13.3 13.4 13.3 --  HCT 39.4 43.0 42.4 42.3 41.8 --  PLT 193 182 189 168 163 --  INR -- -- -- -- -- 1.10  APTT -- -- -- -- -- 27   A:  No acute issues. AT risk for DVT P:  Tolerating heparin DVT prophylaxis - cont same   INFECTIOUS  Lab 08/20/12 0415 08/19/12 0530 08/18/12 0440 08/17/12 0824 08/17/12 0230 08/16/12 1532  WBC 9.4 12.9* 8.2 6.1 6.3 --   PROCALCITON -- -- -- -- -- 0.30   Cultures: Blood 10/10 >> NEG Resp 10/15 >> few Pseudomonas (sens P) >>  Results for orders placed during the hospital encounter of 08/16/12  CULTURE, RESPIRATORY     Status: Normal (Preliminary result)   Collection Time   08/16/12  4:29 PM      Component Value Range Status Comment   Specimen Description TRACHEAL ASPIRATE   Final    Special Requests Immunocompromised   Final    Gram Stain     Final    Value: RARE WBC PRESENT,BOTH PMN AND MONONUCLEAR     RARE SQUAMOUS EPITHELIAL CELLS PRESENT  NO ORGANISMS SEEN   Culture FEW PSEUDOMONAS AERUGINOSA   Final    Report Status PENDING   Incomplete   MRSA PCR SCREENING     Status: Normal   Collection Time   08/17/12  4:58 AM      Component Value Range Status Comment   MRSA by PCR NEGATIVE  NEGATIVE Final      Antibiotics: Ceftaz 10/18 >> Anti-infectives     Start     Dose/Rate Route Frequency Ordered Stop   08/19/12 1800   cefTAZidime (FORTAZ) 1 g in dextrose 5 % 50 mL IVPB        1 g 100 mL/hr over 30 Minutes Intravenous Every 8 hours 08/19/12 1726     08/19/12 1715   cefTAZidime (FORTAZ) injection 1 g  Status:  Discontinued        1 g Intramuscular 3 times per day 08/19/12 1711 08/19/12 1726            A:  Purulent secretions by ETS 10/18 >> purulent bronchitis P:   Change ceftaz to po cipro anticipating early dc  ENDOCRINE  Lab 08/20/12 0352 08/19/12 2355 08/19/12 2014 08/19/12 1520 08/19/12 1143  GLUCAP 108* 119* 119* 102* 172*   A:  Mild steroid induced hyperglycemia. No indication for SSI   P:   monitor   NEUROLOGIC  A:  Mild confusion post extubation on 10/18  - on 08/20/12: CAM-ICU neg for delirium. Has Capacity. Demanding to go home; threatening to pull lines out a P:   Social work and chaplain consult stat to explore meaning of going home Attempt to get hold of large family to communicate with her OOB to chair so she does not feel like she is lying in a  ditch   Dr. Kalman Shan, M.D., F.C.C.P Pulmonary and Critical Care Medicine Staff Physician [facility] [place] [facility] Pager: [phone], If no answer or between  15:00h - 7:00h: call [phone]  08/20/2012 8:55 AM

## 2012-08-20 NOTE — Progress Notes (Signed)
Patient wants to leave hospital.  Attempted to reach family members.  Left voicemail at a home number of Ms. Meredith Gauze, Ms. Meredith Patton stated her family was staying at her home.  Also left a voicemail with her daughter, Meredith Patton at the number provided.  Ms. Meredith Patton provided another number for her daughter Meredith Patton, but the number was a wrong number.  Pastoral care will see her today.

## 2012-08-20 NOTE — Progress Notes (Signed)
I talked to Meredith Patton and her two daughters. Meredith Patton would like to go home, however she is not ready for discharge; she lives on her own and continues to have acute medical problems that need continuous inpatient management. I explained this to her and her daughters. Her daughters agree that she should not be discharged at this time. We are waiting on palliative care consult and social work assessment before further plan for discharge. Hopefully she will decide not to leave against medical advise and continue medical care here.

## 2012-08-20 NOTE — Progress Notes (Signed)
Clinical Social Work Department BRIEF PSYCHOSOCIAL ASSESSMENT 08/20/2012  Patient:  Meredith Patton, Meredith Patton     Account Number:  [id number]     Admit date:  08/16/2012  Clinical Social Worker:  Leron Croak, CLINICAL SOCIAL WORKER  Date/Time:  08/20/2012 04:58 PM  Referred by:  Physician  Date Referred:  08/20/2012 Referred for  Behavioral Health Issues   Other Referral:   Interview type:  Patient Other interview type:   CSW met with Pt, sister, and daughters at the bedside in ICU    PSYCHOSOCIAL DATA Living Status:  ALONE Admitted from facility:   Level of care:   Primary support name:  Deshante Cassell Primary support relationship to patient:  CHILD, ADULT Degree of support available:   Pt lives alone with limited Home Health services offered by "[facility]".    CURRENT CONCERNS Current Concerns  Behavioral Health Issues  Adjustment to Illness   Other Concerns:   CSW was contacted because the Pt was wanting to leave AMA to go home. Medical and CSW are concerned for the Pt's safety and level of care in the home, as the Pt's medical status would require a higher level of care than is available.    SOCIAL WORK ASSESSMENT / PLAN CSW met with the Pt to discuss her desire to be discharged to home with home health services. Pt was recently diagnosed with lung cancer and stated that she "just wants to go home". CSW explained  the concerns that the medical team had with Pt's desires. CSW empathized with the Pt's concerns and feelings offering support with d/c planning to the least invasive but most appropriate level of care necessary to take care of symptoms associated with her illness. Throughtout the assessment the Pt continued to state that "I just want to go home." CSW explained to Pt that I will be working with her re: d/c planning and that I had spoke with the MD concerning her wishes and that we would be working together on the d/c plan. CSW also explained that a Palliative  consult was requested and that service would need time to respond to that consult. At that time the Pt was continuing to state that she wanted to go home despite all above conversations. CSW asked for son and daughters numbers in order to speak with them about her wishe. CSW was not able to contact any family member from the list of numbers provided by the Pt, however the Pt's nurse did speak with Pt's sister and arranged for CSW to meet her at the bedside to dicuss Pt's concerns and wishes for returning home.  CSW met with the sister at the Bedside to discuss Pt's desire to go home. Pt's sister did not want to make the final decision but did explain to the Pt that she felt the Pt should remain and get the care she needed.  CSW spoke with the MD requesting a palliative consult and the possibility of the Pt going home with Hospice. MD did order the palliative consult and advised CSW that the Home Hospice would be something we may be able to discuss with the Pt, only if she continued to pursue her wishes to go home.  CSW later met with the daughters at the bedside. Pt's daughter Verlon Au) stated that the Pt was able to speak with the MD (per Pt request) and after much discussion with Pt and family the Pt has decided to remain in the hospital at this time. CSW reported this to nurse  and MD.   Assessment/plan status:  Psychosocial Support/Ongoing Assessment of Needs Other assessment/ plan:   CSW will continue to follow throughout the weekend for any needs that may arise.   Information/referral to community resources:   No referrals for community services are needed at this time.    PATIENT'S/FAMILY'S RESPONSE TO PLAN OF CARE: Pt continues to have the desire to go home, however has agreed to stay in the hospital until an agreeable d/c plan is agreed upon by family and medical staff.      Leron Croak, LCSWA [employer] [phone]

## 2012-08-20 NOTE — Progress Notes (Signed)
Arthritis pain  Oxycodone ordered

## 2012-08-20 NOTE — Progress Notes (Signed)
Chaplain Note: Chaplain was paged to see this patient by the unit secretary at 9:18am, while en route to the hospital for another call.  Chaplain arrived to see this patient at 12:45pm due to the time spent on the prior page.  The reason for this page was the patient's insistent desire to go home and the concern it raised in the nurse as to whether she was speaking about a death wish or to be in her own home.  Additionally, the patient was very weak with heart issues showing on any movement of the patient for nursing care.  So there was, the Chaplain perceived, a measure of end-of-life concern with respect to the patient's processes.  The Chaplain found the patient to be pleasant and very talkative, but fixed in her desire to go home.  Patient stated she didn't want to be a problem, but she did want to go home.  She told the Chplain of her 6 children, her grandchildren and her great grandchildren.  She also mentioned great-great grandchildren.  The patient is 71 years old, and the Chaplain did wonder if there were in fact great-greats.  In any case, the patient stated her family (children, three in [place] and three down here) said they would care for her, but no family save her sister came to visit her, and they have been difficult to get in touch with (per nurse).  The Chaplain made little progress in helping the patient let go of the feeling of being lied to (she claimed a female nurse had told her she could go home tomorrow, which trying to convince her was likely a misunderstanding failed utterly).  After a time the sister arrived and she had the level of success the chaplain and staff had... None.  While the sister was there, and the two sisters discussed going home, the Chaplain took tim eto pray with the patient.  He then suggested to the patient that she may just be where God wants her, and she needs to be more concerned with showing people her faith and filling God's will for where she is at.  She said she  didn't want to be at the hospital, and the  Chaplain responded that Moses didn't want to go to [place].  She seemed to respond to this line of thought and seemed to understand that wherever one is, one ought to shed his/her light.  The Chaplain shared this approach of making her hospital stay into part of her calling and faith walk with the nurse and Child psychotherapist.  Given her fixation on going home and her inability to fix moments in time, the Chaplain felt there was some confusion with the patient.    The nurse was extraordinarily supportive and helpful to patient and Chaplain.  The Chaplain urges any Chaplain following up to read the Social Worker's report for insight into the unfolding discharge plans and the steps to make it possible.  Rema Jasmine, Chaplain Pager: [phone]

## 2012-08-21 DIAGNOSIS — R609 Edema, unspecified: Secondary | ICD-10-CM

## 2012-08-21 LAB — BASIC METABOLIC PANEL
CO2: 33 mEq/L — ABNORMAL HIGH (ref 19–32)
Chloride: 101 mEq/L (ref 96–112)
Glucose, Bld: 95 mg/dL (ref 70–99)
Potassium: 3.5 mEq/L (ref 3.5–5.1)
Sodium: 139 mEq/L (ref 135–145)

## 2012-08-21 MED ORDER — POTASSIUM CHLORIDE CRYS ER 20 MEQ PO TBCR
40.0000 meq | EXTENDED_RELEASE_TABLET | Freq: Once | ORAL | Status: AC
  Administered 2012-08-21: 40 meq via ORAL
  Filled 2012-08-21: qty 2

## 2012-08-21 MED ORDER — OXYCODONE HCL 5 MG/5ML PO SOLN
7.5000 mg | ORAL | Status: DC | PRN
  Administered 2012-08-21 – 2012-08-25 (×11): 7.5 mg via ORAL
  Filled 2012-08-21: qty 5
  Filled 2012-08-21 (×6): qty 10
  Filled 2012-08-21 (×2): qty 5
  Filled 2012-08-21 (×2): qty 10
  Filled 2012-08-21: qty 5
  Filled 2012-08-21: qty 10

## 2012-08-21 MED ORDER — DILTIAZEM HCL 30 MG PO TABS
30.0000 mg | ORAL_TABLET | Freq: Three times a day (TID) | ORAL | Status: DC
  Administered 2012-08-21 – 2012-08-24 (×8): 30 mg via ORAL
  Filled 2012-08-21 (×12): qty 1

## 2012-08-21 NOTE — Progress Notes (Signed)
VASCULAR LAB PRELIMINARY  PRELIMINARY  PRELIMINARY  PRELIMINARY  Bilateral lower extremity venous Dopplers completed.    Preliminary report:  There is no DVT or SVT noted in the bilateral lower extremities.  Meredith Patton, 08/21/2012, 3:42 PM

## 2012-08-21 NOTE — Progress Notes (Signed)
Name: Meredith Patton MRN: [id number] DOB: 09-30-1941    LOS: 5 Date of admit 08/16/2012   CC: acute respiratory failure  PULMONARY / CRITICAL CARE MEDICINE  Brief patient description:  71 year old female presented to [facility] presented with abrupt onset of large volume of hemoptysis, resulting in respiratory failure 08/11/2012. CT 08/02/12 at [facility]: 1.  No pulmonary embolus to 2. large subcarinal lymph nodes and probable right lower lobe mass. Malignancy could present in this fashion./ Bronch washing at [facility] 08/12/12 - Malignant cells c/w NSCLC.  Transferred to [facility] 08/16/2012. Bronch with likely cancer obstruction BI, collapse RML, RLL, no bleeding.    DEVICES  ETT:  10/10 >> 10/18  EVENTS  Bronch 08/16/12 at [facility]  - Bronchoscopy on 08/16/2012: For Evaluation mass, bleeding localization  1. RUL WNL  2. Bronchus intermedius , about 1.5 cm below RUL take off, mass, multiple white caps on posterior, complete obstruction and extrinsic compression  3. Lavage done at [facility], sent cytology, mild oozing post, 2 cc 1:1000 diluated in 9 cc, placed, no bleeding  4. Left WNL   10/16  CT chest centrally obstructing mass in the right subcarinal region, measuring approximately 3.8 x 5.1 cm, obstructing the bronchus intermedius with resultant heterogeneous collapse of the right middle and right lower lobes. Centrally obstructing mass in the superior segment right lower lobe with postobstructive collapse of the right middle and right lower lobes, and a small right pleural effusion. Findings are most consistent with primary bronchogenic carcinoma. Ct head neg for malignancy  08/17/12 :  Simulation took place on 10/16 with 8 Gray in one fraction  08/18/12 - s/p XRT  08/19/12 - extubated   08/20/12 - threatened AMA. Decided to stay after her sister advised to stay and chaplain connected saying "moses did not want to go to [place]"  SUBJECTIVE/OVERNIGHT/INTERVAL  HX 08/21/12: Finally agreed to stay in hospital but still expressing for early discharge. Having easy tachycardia to 140s with movement so never got out of bed to chair.   Vital Signs: Temp:  [98.2 F (36.8 C)-98.7 F (37.1 C)] 98.3 F (36.8 C) (10/20 0800) Pulse Rate:  [63-136] 70  (10/20 0800) Resp:  [17-23] 18  (10/20 0800) BP: (115-159)/(60-114) 115/79 mmHg (10/20 0800) SpO2:  [93 %-100 %] 98 % (10/20 0807) Weight:  [93.7 kg (206 lb 9.1 oz)] 93.7 kg (206 lb 9.1 oz) (10/20 0500)  Physical Examination: General:   Awake and alert. Looks chronic unwell Neuro:  Intact. RASS 0 to +1. CAm-ICU neg for delirum HEENT:  [place]/AT, PERRL, EOM-I and MMM. Neck:   Supple, -LAN and -thyromegally. Cardiovascular:  S1, S2 normal. No gallops no rubs no murmurs. HSIR Lungs:   lung fields clear, reduced rt base Abdomen:   bowel sounds present, abdomen soft nontender Musculoskeletal:   no clubbing no edema no cyanosis  Skin:  intact no ulcers or lesions seen  Active Problems:  Major hemoptysis  Acute respiratory failure  Lung mass  Atrial fibrillation  Diastolic heart failure  Weight loss  HTN (hypertension)  Cancer of bronchus and lung  Atelectasis of right lung   ASSESSMENT AND PLAN  PULMONARY  Lab 08/19/12 0426 08/18/12 0405 08/17/12 1115 08/17/12 0522  PHART 7.415 7.498* 7.450 7.497*  PCO2ART 55.0* 43.2 51.9* 44.3  PO2ART 71.5* 79.6* 69.7* 64.4*  HCO3 34.7* 33.2* 35.6* 34.0*  O2SAT 92.6 95.5 92.3 91.6   Ventilator Settings:      A:   Acute  Respiratory failure Has h/o  oxygen dependence. Also carries h/o PAH Hemoptysis  due to a lung neoplasm and prob c/b anticoagulation - none further NSCCa of lung  RML, RLL collapse due to airway obstruction: Extensive endobronchial tumor burden on bedside Bronchoscopy with complete obstruction of bronchus intermedius.    - on 08/20/12 and 08/21/12: Tolerating extubation well. No respiratory distress.   P:   Post extubation airway  hygiene Cont XRT as directed per Rad onc Onc following   CARDIOVASCULAR  Lines: R IJ CVL 10/10 >>  ECHO:  Echo performed at [facility] 10/11 LV systolic function is normal EF is greater than 55% right ventricle is moderately dilated. The right atrium is moderately dilated.  A:   H/o diastolic dysfxn pulmonary hypertension Atrial fib Huston Foley 10/16, asymptomatic   - on 08/20/12: easy tachycardia with any movement ? Pressure effect of cancer on heart. Preventing her from getting OOB to chair P:  Start po cardizem Duplex LE as ordered by Elink MD   RENAL  Lab 08/21/12 0550 08/20/12 0415 08/19/12 0530 08/18/12 0440 08/17/12 0230 08/16/12 1532  NA 139 143 144 141 138 --  K 3.5 3.3* -- -- -- --  CL 101 105 103 101 98 --  CO2 33* 32 35* 33* 35* --  BUN 16 28* 34* 29* 34* --  CREATININE 0.45* 0.49* 0.56 0.53 0.56 --  CALCIUM 8.2* 8.2* 8.9 8.8 8.5 --  MG -- -- 2.9* 2.9* -- 2.8*  PHOS -- -- 4.0 2.9 -- 3.8   Intake/Output      10/19 0701 - 10/20 0700 10/20 0701 - 10/21 0700   I.V. (mL/kg) 480 (5.1) 20 (0.2)   IV Piggyback 20    Total Intake(mL/kg) 500 (5.3) 20 (0.2)   Urine (mL/kg/hr) 820 (0.4) 95   Total Output 820 95   Net -320 -75          Intake/Output Summary (Last 24 hours) at 08/21/12 0957 Last data filed at 08/21/12 0800  Gross per 24 hour  Intake    480 ml  Output    850 ml  Net   -370 ml    A:  Mild hypokalemia P:   Replete K   GASTROINTESTINAL  Lab 08/18/12 0440 08/16/12 1532  AST 13 56*  ALT 57* 101*  ALKPHOS 97 100  BILITOT 0.7 0.4  PROT 7.2 7.6  ALBUMIN 2.9* 3.1*    A:  Weight loss >50-60 lbs unintentional one year  P:   Advance diet as tolerated Continue ppi  HEMATOLOGIC  Lab 08/20/12 0415 08/19/12 0530 08/18/12 0440 08/17/12 0824 08/17/12 0230 08/16/12 1532  HGB 12.0 13.1 13.3 13.4 13.3 --  HCT 39.4 43.0 42.4 42.3 41.8 --  PLT 193 182 189 168 163 --  INR -- -- -- -- -- 1.10  APTT -- -- -- -- -- 27   A:  No  acute issues. AT risk for DVT P:  Tolerating heparin DVT prophylaxis - cont same Duplex LE as ordered by elink for tachycardia  INFECTIOUS  Lab 08/20/12 0415 08/19/12 0530 08/18/12 0440 08/17/12 0824 08/17/12 0230 08/16/12 1532  WBC 9.4 12.9* 8.2 6.1 6.3 --  PROCALCITON -- -- -- -- -- 0.30   Cultures: Blood 10/10 >> NEG Resp 10/15 >> few Pseudomonas (sens P) >>  Results for orders placed during the hospital encounter of 08/16/12  CULTURE, RESPIRATORY     Status: Normal   Collection Time   08/16/12  4:29 PM      Component Value  Range Status Comment   Specimen Description TRACHEAL ASPIRATE   Final    Special Requests Immunocompromised   Final    Gram Stain     Final    Value: RARE WBC PRESENT,BOTH PMN AND MONONUCLEAR     RARE SQUAMOUS EPITHELIAL CELLS PRESENT     NO ORGANISMS SEEN   Culture FEW PSEUDOMONAS AERUGINOSA   Final    Report Status 08/20/2012 FINAL   Final    Organism ID, Bacteria PSEUDOMONAS AERUGINOSA   Final   MRSA PCR SCREENING     Status: Normal   Collection Time   08/17/12  4:58 AM      Component Value Range Status Comment   MRSA by PCR NEGATIVE  NEGATIVE Final      Antibiotics: Ceftaz 10/18 >> Anti-infectives     Start     Dose/Rate Route Frequency Ordered Stop   08/20/12 0930   ciprofloxacin (CIPRO) tablet 750 mg        750 mg Oral 2 times daily 08/20/12 0855     08/19/12 1800   cefTAZidime (FORTAZ) 1 g in dextrose 5 % 50 mL IVPB  Status:  Discontinued        1 g 100 mL/hr over 30 Minutes Intravenous Every 8 hours 08/19/12 1726 08/20/12 0855   08/19/12 1715   cefTAZidime (FORTAZ) injection 1 g  Status:  Discontinued        1 g Intramuscular 3 times per day 08/19/12 1711 08/19/12 1726            A:  Purulent secretions by ETS 10/18 >> purulent bronchitis P:   Change ceftaz to po cipro  And finish 14d total course  ENDOCRINE  Lab 08/20/12 1224 08/20/12 0814 08/20/12 0352 08/19/12 2355 08/19/12 2014  GLUCAP 113* 93 108* 119* 119*   A:   Mild steroid induced hyperglycemia. No indication for SSI   P:   monitor   NEUROLOGIC  A:  Mild confusion post extubation on 10/18  - on 08/20/12: CAM-ICU neg for delirium. Has Capacity. Demanding to go home; threatening to pull lines out  - on  08/21/12:  Has agreed to stay but still wanting erly discharge ASAP. Goals of care for cancer Rx unclear P:   Appreciate Social work and chaplain consult 08/20/12 OOB to chair DC foley  Dr. Kalman Shan, M.D., [facility].C.P Pulmonary and Critical Care Medicine Staff Physician [facility] [place] [facility] Critical [facility] Pager: [phone], If no answer or between  15:00h - 7:00h: call [phone]  08/21/2012 9:57 AM

## 2012-08-22 LAB — BASIC METABOLIC PANEL
CO2: 31 mEq/L (ref 19–32)
Calcium: 8.1 mg/dL — ABNORMAL LOW (ref 8.4–10.5)
Creatinine, Ser: 0.47 mg/dL — ABNORMAL LOW (ref 0.50–1.10)
GFR calc non Af Amer: >90 mL/min (ref >90)
Sodium: 135 mEq/L (ref 135–145)

## 2012-08-22 LAB — MAGNESIUM: Magnesium: 2.2 mg/dL (ref 1.5–2.5)

## 2012-08-22 LAB — CBC
MCH: 25.7 pg — ABNORMAL LOW (ref 26.0–34.0)
MCHC: 31.2 g/dL (ref 30.0–36.0)
MCV: 82.2 fL (ref 78.0–100.0)
Platelets: 185 10*3/uL (ref 150–400)
RBC: 4.95 MIL/uL (ref 3.87–5.11)
RDW: 17.9 % — ABNORMAL HIGH (ref 11.5–15.5)

## 2012-08-22 LAB — PHOSPHORUS: Phosphorus: 2.9 mg/dL (ref 2.3–4.6)

## 2012-08-22 MED ORDER — BOOST / RESOURCE BREEZE PO LIQD
1.0000 | Freq: Three times a day (TID) | ORAL | Status: DC
  Administered 2012-08-22 – 2012-08-23 (×4): 1 via ORAL

## 2012-08-22 NOTE — Progress Notes (Signed)
Chaplain Note: visited with while the patient I was paged for was with the nurse.  This patient seemed very tired, but she "found" enough strength to talk and tell me she still wanted to go home.  She said she had lots of visits to.  We prayed together. She is still having heart issues. The Chaplain reminded her of her discussion yesterday about Patrcia Dolly not wanting to go to [place] and she remembered it.  We talked some of walking in faith and she and lights up with such talk.  We prayed, a little more conversation about praying and perhaps for some of the others here and the staff, and then Chaplain left.  Rema Jasmine, Chaplain Pager: [phone]

## 2012-08-22 NOTE — Progress Notes (Signed)
Report called to [facility], RN on 4th floor, plan to transfer to 1408. VSS, AFib on monitor, central line in RIJ flushed x 3 ports. Bath given this am. Voiding per bedpan, no complaints of pain. Poor appetite noted, up to chair by skylift yesterday am. Dietician consulted to encourage appetite.

## 2012-08-22 NOTE — Progress Notes (Signed)
Nutrition Follow-up  Intervention:   Recommend changing diet to Regular. Resource [place]ze tid Encourage po, provide preferences.  Assessment:   Pt with NSCLC.  Tachycardia when she moves.  Urine output decreased.  Per Dr. Welton Flakes, pt needs to gain strength prior to any further cancer treatment treatment as an outpatient.  Pt with poor appetite and intake.  "I have never eaten diet food and I never will."  Dislikes Ensure and Milk.  Diet Order:  Heart Healthy  Meds: Scheduled Meds:   . albuterol  2.5 mg Nebulization Q6H  . antiseptic oral rinse  15 mL Mouth Rinse QID  . bimatoprost  1 drop Both Eyes QHS  . chlorhexidine  15 mL Mouth Rinse BID  . ciprofloxacin  750 mg Oral BID  . diltiazem  30 mg Oral Q8H  . heparin subcutaneous  5,000 Units Subcutaneous Q8H  . pantoprazole (PROTONIX) IV  40 mg Intravenous Q12H  . potassium chloride  40 mEq Oral Once   Continuous Infusions:  PRN Meds:.ALPRAZolam, oxyCODONE, DISCONTD: sodium chloride, DISCONTD: oxyCODONE  Labs:  CMP     Component Value Date/Time   NA 135 08/22/2012 0352   K 3.6 08/22/2012 0352   CL 99 08/22/2012 0352   CO2 31 08/22/2012 0352   GLUCOSE 106* 08/22/2012 0352   BUN 12 08/22/2012 0352   CREATININE 0.47* 08/22/2012 0352   CALCIUM 8.1* 08/22/2012 0352   PROT 7.2 08/18/2012 0440   ALBUMIN 2.9* 08/18/2012 0440   AST 13 08/18/2012 0440   ALT 57* 08/18/2012 0440   ALKPHOS 97 08/18/2012 0440   BILITOT 0.7 08/18/2012 0440   GFRNONAA >90 08/22/2012 0352   GFRAA >90 08/22/2012 0352     Intake/Output Summary (Last 24 hours) at 08/22/12 0941 Last data filed at 08/22/12 0400  Gross per 24 hour  Intake    190 ml  Output    105 ml  Net     85 ml    Weight Status:  93.7 kg 10/20 (range 90 kg-93.7 kg)  Re-estimated needs:  1800-1900 kcal, 95-115 gm protein  Nutrition Dx:  Inadequate oral intake-ongoing  Goal:  Intake of diet to meet 100% estimated needs.  Monitor:  Oral intake, labs, weight   Oran Rein, RD,  LDN Clinical Inpatient Dietitian Pager:  [phone] Weekend and after hours pager:  [phone]

## 2012-08-22 NOTE — Progress Notes (Signed)
Pt's  bladder was scanned after no urine output for about 6hrs. of urine in bladder per scanner.Pt denied urge to void and refused bedpan offer. PO fluid offered and encouraged. Will continue to monitor.

## 2012-08-22 NOTE — Progress Notes (Signed)
Name: Meredith Patton MRN: [id number] DOB: Sep 27, 1941    LOS: 6 Date of admit 08/16/2012   CC: acute respiratory failure  PULMONARY / CRITICAL CARE MEDICINE  Brief patient description:  71 year old female presented to [facility] presented with abrupt onset of large volume of hemoptysis, resulting in respiratory failure 08/11/2012. CT 08/02/12 at [facility]: 1.  No pulmonary embolus to 2. large subcarinal lymph nodes and probable right lower lobe mass. Malignancy could present in this fashion./ Bronch washing at [facility] 08/12/12 - Malignant cells c/w NSCLC.  Transferred to [facility] 08/16/2012. Bronch with likely cancer obstruction BI, collapse RML, RLL, no bleeding.    DEVICES  ETT:  10/10 >> 10/18  EVENTS  Bronch 08/16/12 at [facility]  - Bronchoscopy on 08/16/2012: For Evaluation mass, bleeding localization  1. RUL WNL  2. Bronchus intermedius , about 1.5 cm below RUL take off, mass, multiple white caps on posterior, complete obstruction and extrinsic compression  3. Lavage done at [facility], sent cytology, mild oozing post, 2 cc 1:1000 diluated in 9 cc, placed, no bleeding  4. Left WNL   10/16  CT chest centrally obstructing mass in the right subcarinal region, measuring approximately 3.8 x 5.1 cm, obstructing the bronchus intermedius with resultant heterogeneous collapse of the right middle and right lower lobes. Centrally obstructing mass in the superior segment right lower lobe with postobstructive collapse of the right middle and right lower lobes, and a small right pleural effusion. Findings are most consistent with primary bronchogenic carcinoma. Ct head neg for malignancy  08/17/12 :  Simulation took place on 10/16 with 8 Gray in one fraction  08/18/12 - s/p XRT  08/19/12 - extubated   08/20/12 - threatened AMA. Decided to stay after her sister advised to stay and chaplain connected saying "moses did not want to go to [place]"  SUBJECTIVE/OVERNIGHT/INTERVAL  HX 08/21/12: Finally agreed to stay in hospital but still expressing for early discharge. Having easy tachycardia to 140s with movement so never got out of bed to chair.   Vital Signs: Temp:  [98.2 F (36.8 C)-99.1 F (37.3 C)] 99.1 F (37.3 C) (10/21 0800) Pulse Rate:  [46-103] 79  (10/21 0800) Resp:  [18-22] 19  (10/21 0800) BP: (120-152)/(58-90) 120/72 mmHg (10/21 0800) SpO2:  [91 %-100 %] 96 % (10/21 1006)  Physical Examination: General:   Awake and alert. Looks chronic unwell Neuro:  Intact. RASS 0 to +1. CAm-ICU neg for delirum HEENT:  [place]/AT, PERRL, EOM-I and MMM. Neck:   Supple, -LAN and -thyromegally. Cardiovascular:  S1, S2 normal. No gallops no rubs no murmurs. HSIR Lungs:   lung fields clear, reduced rt base Abdomen:   bowel sounds present, abdomen soft nontender Musculoskeletal:   no clubbing no edema no cyanosis  Skin:  intact no ulcers or lesions seen  Active Problems:  Major hemoptysis  Acute respiratory failure  Lung mass  Atrial fibrillation  Diastolic heart failure  Weight loss  HTN (hypertension)  Cancer of bronchus and lung  Atelectasis of right lung   ASSESSMENT AND PLAN  PULMONARY  Lab 08/19/12 0426 08/18/12 0405 08/17/12 1115 08/17/12 0522  PHART 7.415 7.498* 7.450 7.497*  PCO2ART 55.0* 43.2 51.9* 44.3  PO2ART 71.5* 79.6* 69.7* 64.4*  HCO3 34.7* 33.2* 35.6* 34.0*  O2SAT 92.6 95.5 92.3 91.6   Ventilator Settings:      A:   Acute  Respiratory failure Has h/o oxygen dependence. Also carries h/o PAH Hemoptysis  due to a lung neoplasm and prob c/b  anticoagulation - none further NSCCa of lung  RML, RLL collapse due to airway obstruction: Extensive endobronchial tumor burden on bedside Bronchoscopy with complete obstruction of bronchus intermedius.    - on 08/20/12 and 08/21/12: Tolerating extubation well. No respiratory distress.   P:   Post extubation airway hygiene Cont XRT as directed per Rad onc Onc following IS Off  decadron  CARDIOVASCULAR  Lines: R IJ CVL 10/10 >>   ECHO:  Echo performed at [facility] 10/11 LV systolic function is normal EF is greater than 55% right ventricle is moderately dilated. The right atrium is moderately dilated.  A:   H/o diastolic dysfxn pulmonary hypertension Atrial fib Huston Foley 10/16, asymptomatic   - on 08/20/12: easy tachycardia with any movement ? unclear P:  Start po cardizem Duplex LE as ordered by Elink MD - pending  RENAL  Lab 08/22/12 0352 08/21/12 0550 08/20/12 0415 08/19/12 0530 08/18/12 0440 08/16/12 1532  NA 135 139 143 144 141 --  K 3.6 3.5 -- -- -- --  CL 99 101 105 103 101 --  CO2 31 33* 32 35* 33* --  BUN 12 16 28* 34* 29* --  CREATININE 0.47* 0.45* 0.49* 0.56 0.53 --  CALCIUM 8.1* 8.2* 8.2* 8.9 8.8 --  MG 2.2 -- -- 2.9* 2.9* 2.8*  PHOS 2.9 -- -- 4.0 2.9 3.8   Intake/Output      10/20 0701 - 10/21 0700 10/21 0701 - 10/22 0700   P.O. 160 100   I.V. (mL/kg) 40 (0.4)    IV Piggyback 30 20   Total Intake(mL/kg) 230 (2.5) 120 (1.3)   Urine (mL/kg/hr) 200 (0.1)    Total Output 200    Net +30 +120        Urine Occurrence 1 x 1 x   Stool Occurrence       Intake/Output Summary (Last 24 hours) at 08/22/12 1055 Last data filed at 08/22/12 0950  Gross per 24 hour  Intake    290 ml  Output    105 ml  Net    185 ml    A:  Mild hypokalemia P:   Replete K Pos balance again, low threshold lasix to neg  GASTROINTESTINAL  Lab 08/18/12 0440 08/16/12 1532  AST 13 56*  ALT 57* 101*  ALKPHOS 97 100  BILITOT 0.7 0.4  PROT 7.2 7.6  ALBUMIN 2.9* 3.1*    A:  Weight loss >50-60 lbs unintentional one year  P:   Advance diet as tolerated Continue ppi  HEMATOLOGIC  Lab 08/22/12 0500 08/20/12 0415 08/19/12 0530 08/18/12 0440 08/17/12 0824 08/16/12 1532  HGB 12.7 12.0 13.1 13.3 13.4 --  HCT 40.7 39.4 43.0 42.4 42.3 --  PLT 185 193 182 189 168 --  INR -- -- -- -- -- 1.10  APTT -- -- -- -- -- 27   A:  No acute  issues. AT risk for DVT P:  Tolerating heparin DVT prophylaxis - cont same Duplex LE as ordered by elink for tachycardia  INFECTIOUS  Lab 08/22/12 0500 08/20/12 0415 08/19/12 0530 08/18/12 0440 08/17/12 0824 08/16/12 1532  WBC 7.5 9.4 12.9* 8.2 6.1 --  PROCALCITON -- -- -- -- -- 0.30   Cultures: Blood 10/10 >> NEG Resp 10/15 >> few Pseudomonas (sens P) >>   Antibiotics: Ceftaz 10/18 >>dc 10-19 cipro>>  Micro: 10-19 sput>> PSEUDOMONAS AERUGINOSA   A:  Purulent secretions by ETS 10/18 >> purulent bronchitis? Vs post obstructive organism P:   Change ceftaz  to po cipro  And finish 14d total course  ENDOCRINE  Lab 08/20/12 1224 08/20/12 0814 08/20/12 0352 08/19/12 2355 08/19/12 2014  GLUCAP 113* 93 108* 119* 119*   A:  Mild steroid induced hyperglycemia. No indication for SSI   P:   Monitor off roids  NEUROLOGIC  A:  Mild confusion post extubation on 10/18  - on 08/20/12: CAM-ICU neg for delirium. Has Capacity. Demanding to go home; threatening to pull lines out  - on  08/21/12:  Has agreed to stay but still wanting erly discharge ASAP. Goals of care for cancer are as below. 10-21 spoke with Dr. Welton Flakes. Patient needs to gain strength prior to any further cancer treatment treatment as an outpatient. Tx to floor. P:   Appreciate Social work and chaplain consult 08/20/12 OOB to chair DC foley  Global:  Transfer to floor and to triad and or oncology service 10-22 if PCCM MD agrees.  Mcarthur Rossetti. Tyson Alias, MD, FACP Pgr: [phone] [place] [facility]monary & Critical [facility]

## 2012-08-22 NOTE — Clinical Social Work Note (Signed)
CSW left message for daughter, as no family present currently. CSW discussed Pt with Brett Canales Minor and we will be actively involved in working on plan for Pt. Pt had HH at home prior to admission and has many, many family members that may be able to assist. PT pending and Pt may need SNF briefly prior to return to home. Pt eager to return home from events over the weekend.  CSW will follow and awaits daughter's return call.   Doreen Salvage, LCSW ICU/Stepdown Clinical Social Worker [facility] Cell [phone] Hours 8am-1200pm M-F

## 2012-08-22 NOTE — Progress Notes (Signed)
Daughter, Cordelia Pen notified of pt moving to 1408 by phone.

## 2012-08-22 NOTE — Progress Notes (Signed)
Bladder scan repeated, less than 200 ml urine in bladder. Pt  denies need to void, bedpan refused. Pt has minimal PO fluid intake. Will continue to monitor.

## 2012-08-23 ENCOUNTER — Inpatient Hospital Stay (HOSPITAL_COMMUNITY): Payer: Medicare Other

## 2012-08-23 LAB — BASIC METABOLIC PANEL
BUN: 9 mg/dL (ref 6–23)
Calcium: 8.4 mg/dL (ref 8.4–10.5)
GFR calc Af Amer: >90 mL/min (ref >90)
GFR calc non Af Amer: >90 mL/min (ref >90)
Glucose, Bld: 93 mg/dL (ref 70–99)
Sodium: 135 mEq/L (ref 135–145)

## 2012-08-23 LAB — CBC
HCT: 40.5 % (ref 36.0–46.0)
Hemoglobin: 12.7 g/dL (ref 12.0–15.0)
MCH: 25.7 pg — ABNORMAL LOW (ref 26.0–34.0)
MCHC: 31.4 g/dL (ref 30.0–36.0)
RDW: 18 % — ABNORMAL HIGH (ref 11.5–15.5)

## 2012-08-23 MED ORDER — ALBUTEROL SULFATE (5 MG/ML) 0.5% IN NEBU
2.5000 mg | INHALATION_SOLUTION | Freq: Four times a day (QID) | RESPIRATORY_TRACT | Status: DC
  Administered 2012-08-24 – 2012-08-25 (×4): 2.5 mg via RESPIRATORY_TRACT
  Filled 2012-08-23 (×5): qty 0.5

## 2012-08-23 MED ORDER — ALBUTEROL SULFATE (5 MG/ML) 0.5% IN NEBU: 2.5000 mg | INHALATION_SOLUTION | RESPIRATORY_TRACT | Status: DC | PRN

## 2012-08-23 MED ORDER — IPRATROPIUM BROMIDE 0.02 % IN SOLN
0.5000 mg | Freq: Four times a day (QID) | RESPIRATORY_TRACT | Status: DC
  Administered 2012-08-24 – 2012-08-25 (×4): 0.5 mg via RESPIRATORY_TRACT
  Filled 2012-08-23 (×5): qty 2.5

## 2012-08-23 MED ORDER — IOHEXOL 300 MG/ML  SOLN
100.0000 mL | Freq: Once | INTRAMUSCULAR | Status: AC | PRN
  Administered 2012-08-23: 100 mL via INTRAVENOUS

## 2012-08-23 MED ORDER — SODIUM CHLORIDE 0.9 % IJ SOLN
10.0000 mL | INTRAMUSCULAR | Status: DC | PRN
  Administered 2012-08-23 – 2012-08-24 (×5): 10 mL
  Administered 2012-08-25: 30 mL

## 2012-08-23 MED ORDER — ZOLPIDEM TARTRATE 5 MG PO TABS
5.0000 mg | ORAL_TABLET | Freq: Once | ORAL | Status: AC
  Administered 2012-08-23: 5 mg via ORAL
  Filled 2012-08-23: qty 1

## 2012-08-23 NOTE — Progress Notes (Signed)
Meredith Patton   DOB:1940-11-20   ZO#:[id number]   JWJ#:[id number]  Subjective: Patient is seen on the regular medical oncology floor. She is awake alert. Patient is very upset as she wants to go home. Her family is by her bedside. She is denying any nausea vomiting fevers or chills no comedo pain no cough no shortness of breath.   Objective:  Filed Vitals:   08/23/12 1517  BP: 120/72  Pulse: 70  Temp: 98.1 F (36.7 C)  Resp: 18    Body mass index is 29.64 kg/(m^2).  Intake/Output Summary (Last 24 hours) at 08/23/12 1526 Last data filed at 08/23/12 1015  Gross per 24 hour  Intake    580 ml  Output      0 ml  Net    580 ml     Sclerae unicteric  Oropharynx clear  No peripheral adenopathy  Lungs clear -- no rales or rhonchi  Heart regular rate and rhythm  Abdomen benign  MSK no focal spinal tenderness, no peripheral edema  Neuro nonfocal    CBG (last 3)  No results found for this basename: GLUCAP:3 in the last 72 hours   Labs:  Lab Results  Component Value Date   WBC 6.7 08/23/2012   HGB 12.7 08/23/2012   HCT 40.5 08/23/2012   MCV 82.0 08/23/2012   PLT 196 08/23/2012   NEUTROABS 5.1 08/17/2012    Urine Studies No results found for this basename: UACOL:2,UAPR:2,USPG:2,UPH:2,UTP:2,UGL:2,UKET:2,UBIL:2,UHGB:2,UNIT:2,UROB:2,ULEU:2,UEPI:2,UWBC:2,URBC:2,UBAC:2,CAST:2,CRYS:2,UCOM:2,BILUA:2 in the last 72 hours  Basic Metabolic Panel:  Lab 08/23/12 6213 08/22/12 0352 08/21/12 0550 08/20/12 0415 08/19/12 0530 08/18/12 0440 08/16/12 1532  NA 135 135 139 143 144 -- --  K 4.1 3.6 -- -- -- -- --  CL 99 99 101 105 103 -- --  CO2 31 31 33* 32 35* -- --  GLUCOSE 93 106* 95 110* 131* -- --  BUN 9 12 16  28* 34* -- --  CREATININE 0.46* 0.47* 0.45* 0.49* 0.56 -- --  CALCIUM 8.4 8.1* 8.2* 8.2* 8.9 -- --  MG 2.1 2.2 -- -- 2.9* 2.9* 2.8*  PHOS 3.4 2.9 -- -- 4.0 2.9 3.8   GFR Estimated Creatinine Clearance: 81.2 ml/min (by C-G formula based on Cr of 0.46). Liver Function  Tests:  Lab 08/18/12 0440 08/16/12 1532  AST 13 56*  ALT 57* 101*  ALKPHOS 97 100  BILITOT 0.7 0.4  PROT 7.2 7.6  ALBUMIN 2.9* 3.1*    Lab 08/16/12 1532  LIPASE 10*  AMYLASE --   No results found for this basename: AMMONIA:5 in the last 168 hours Coagulation profile  Lab 08/16/12 1532  INR 1.10  PROTIME --    CBC:  Lab 08/23/12 0530 08/22/12 0500 08/20/12 0415 08/19/12 0530 08/18/12 0440 08/17/12 0824  WBC 6.7 7.5 9.4 12.9* 8.2 --  NEUTROABS -- -- -- -- -- 5.1  HGB 12.7 12.7 12.0 13.1 13.3 --  HCT 40.5 40.7 39.4 43.0 42.4 --  MCV 82.0 82.2 82.9 83.3 81.7 --  PLT 196 185 193 182 189 --   Cardiac Enzymes:  Lab 08/17/12 0252  CKTOTAL 21  CKMB 1.6  CKMBINDEX --  TROPONINI <0.30   BNP: No components found with this basename: POCBNP:5 CBG:  Lab 08/20/12 1224 08/20/12 0814 08/20/12 0352 08/19/12 2355 08/19/12 2014  GLUCAP 113* 93 108* 119* 119*   D-Dimer No results found for this basename: DDIMER:2 in the last 72 hours Hgb A1c No results found for this basename: HGBA1C:2 in the last 72 hours Lipid  Profile No results found for this basename: CHOL:2,HDL:2,LDLCALC:2,TRIG:2,CHOLHDL:2,LDLDIRECT:2 in the last 72 hours Thyroid function studies No results found for this basename: TSH,T4TOTAL,FREET3,T3FREE,THYROIDAB in the last 72 hours Anemia work up No results found for this basename: VITAMINB12:2,FOLATE:2,FERRITIN:2,TIBC:2,IRON:2,RETICCTPCT:2 in the last 72 hours Microbiology Recent Results (from the past 240 hour(s))  CULTURE, RESPIRATORY     Status: Normal   Collection Time   08/16/12  4:29 PM      Component Value Range Status Comment   Specimen Description TRACHEAL ASPIRATE   Final    Special Requests Immunocompromised   Final    Gram Stain     Final    Value: RARE WBC PRESENT,BOTH PMN AND MONONUCLEAR     RARE SQUAMOUS EPITHELIAL CELLS PRESENT     NO ORGANISMS SEEN   Culture FEW PSEUDOMONAS AERUGINOSA   Final    Report Status 08/20/2012 FINAL   Final     Organism ID, Bacteria PSEUDOMONAS AERUGINOSA   Final   MRSA PCR SCREENING     Status: Normal   Collection Time   08/17/12  4:58 AM      Component Value Range Status Comment   MRSA by PCR NEGATIVE  NEGATIVE Final       Studies:  No results found.  Assessment: 71 y.o.  #1 hemoptysis secondary to obstructing lung lesion now resolved patient did undergo one dose of radiation therapy. She was successfully extubated yesterday now is on a regular medicine floor.  #2 non-small cell lung carcinoma. Patient will need further treatments and I did discuss this with the family. However patient at this time does not want to talk about this. Her main concern is that she wants to go home.    Plan:   #1 I do think that patient would be a good candidate for chemotherapy concurrently with radiation therapy or alone. I discussed this with the family. However we would not start this until patient has been discharged from the hospital.  #2 I would recommend patient have a CT of the abdomen and pelvis as well as a bone scan to complete staging studies. We certainly can get a PET scan but we can do this as an outpatient.  #3 I will continue to follow the patient with you   Drue Second 08/23/2012

## 2012-08-23 NOTE — Progress Notes (Signed)
CSW met with patient and family at bedside. Patient is now open and agreeable to snf. Agreeable to being faxed out and receiving bed offers. CSW faxed patient out and will continue to follow.  Meredith Patton C. Nino Amano MSW, LCSW [phone]

## 2012-08-23 NOTE — Evaluation (Signed)
Physical Therapy Evaluation Patient Details Name: Meredith Patton MRN: [id number] DOB: [birth year] Today's Date: 08/23/2012 Time: 4401-0272 PT Time Calculation (min): 19 min  PT Assessment / Plan / Recommendation Clinical Impression  Pt presents with hemoptysis and recent diagnosis of lung cancer.  Tolerated OOB and took some steps from bed to chair, however pt very weak and became very SOB during all mobility.  RN entered room following getting to EOB and stated that HR had elevated, therefore deferred further amb into hallway.  HR at end of session was 101 with O2 sats at 87% with cues for pursed lip breathing.  Pt will benefit from skilled PT in acute venue to address deficits.  PT recommends SNF for follow up at D/C to maximize pts independence.     PT Assessment  Patient needs continued PT services    Follow Up Recommendations  Supervision/Assistance - 24 hour;Post acute inpatient    Does the patient have the potential to tolerate intense rehabilitation   No, Recommend SNF  Barriers to Discharge Decreased caregiver support Unsure of how much family will be supporting pt at D/C.     Equipment Recommendations  Rolling walker with 5" wheels    Recommendations for Other Services OT consult   Frequency Min 3X/week    Precautions / Restrictions Precautions Precautions: Fall Restrictions Weight Bearing Restrictions: No   Pertinent Vitals/Pain No pain, pt stated that she had gotten pain meds earlier.       Mobility  Bed Mobility Bed Mobility: Supine to Sit;Sitting - Scoot to Edge of Bed Supine to Sit: 4: Min assist Sitting - Scoot to [place] of Bed: 4: Min assist Details for Bed Mobility Assistance: Some assist for trunk when sitting up with multiple cues for attending to task throughout bed mobility.   Transfers Transfers: Sit to Stand;Stand to Sit Sit to Stand: 1: +2 Total assist;From elevated surface;With upper extremity assist;From bed Sit to Stand: Patient Percentage:  60% Stand to Sit: 1: +2 Total assist;With upper extremity assist;With armrests;To chair/3-in-1 Stand to Sit: Patient Percentage: 60% Details for Transfer Assistance: Assist to rise and steady with max cues for hand placement, safety and controlled descent when sitting.  Ambulation/Gait Ambulation/Gait Assistance: 1: +2 Total assist Ambulation/Gait: Patient Percentage: 50% Ambulation Distance (Feet): 5 Feet Assistive device: Rolling walker Ambulation/Gait Assistance Details: Assist to steady throughout with cues for sequencing/technique with RW and again, multiple cues for attending to task.  Did not ambulate further due to pt very SOB and RN had entered room stating that HR had increased during bed mobiltiy.  Gait Pattern: Step-to pattern;Decreased stride length;Trunk flexed;Shuffle Gait velocity: decreased Stairs: No Wheelchair Mobility Wheelchair Mobility: No    Shoulder Instructions     Exercises     PT Diagnosis: Difficulty walking;Generalized weakness  PT Problem List: Decreased strength;Decreased range of motion;Decreased activity tolerance;Decreased balance;Decreased mobility;Decreased cognition;Decreased knowledge of use of DME;Decreased safety awareness PT Treatment Interventions: DME instruction;Gait training;Patient/family education;Functional mobility training;Therapeutic activities;Therapeutic exercise;Balance training   PT Goals Acute Rehab PT Goals PT Goal Formulation: With patient Time For Goal Achievement: 09/06/12 Potential to Achieve Goals: Good Pt will go Supine/Side to Sit: with supervision PT Goal: Supine/Side to Sit - Progress: Goal set today Pt will go Sit to Supine/Side: with supervision PT Goal: Sit to Supine/Side - Progress: Goal set today Pt will go Sit to Stand: with supervision PT Goal: Sit to Stand - Progress: Goal set today Pt will go Stand to Sit: with supervision PT Goal: Stand to Sit -  Progress: Goal set today Pt will Ambulate: 51 - 150  feet;with supervision;with least restrictive assistive device PT Goal: Ambulate - Progress: Goal set today  Visit Information  Last PT Received On: 08/23/12 Assistance Needed: +2    Subjective Data  Subjective: What do you all want me to do? Patient Stated Goal: to go home.   Prior Functioning  Home Living Lives With: Alone Available Help at Discharge: Available 24 hours/day (short term) Type of Home: Apartment Home Access: Level entry Home Layout: One level Bathroom Shower/Tub: Tub/shower unit Home Adaptive Equipment: Shower chair with back;Straight cane Additional Comments: Pt had a family member in with her at time of eval and states that she is from [place] but here to stay for a little while.  However question 24/7 supervision/assist due to family member stating 'You have to ask her, I'm out of this" when talking to pt about [facility] SNF.  Prior Function Level of Independence: Independent with assistive device(s) Able to Take Stairs?: Yes Driving: No Vocation: Retired Musician: No difficulties    Cognition  Overall Cognitive Status: Appears within functional limits for tasks assessed/performed Arousal/Alertness: Awake/alert Orientation Level: Appears intact for tasks assessed Behavior During Session: [facility] for tasks performed Cognition - Other Comments: Pt was slow to process verbal commands.  Pts family member states that she has been somewhat confused since being extubated.     Extremity/Trunk Assessment Right Lower Extremity Assessment RLE ROM/Strength/Tone: Deficits RLE ROM/Strength/Tone Deficits: Generalized weakness, grossly 3/5 per functional assessment.  RLE Sensation: WFL - Light Touch Left Lower Extremity Assessment LLE ROM/Strength/Tone: Deficits LLE ROM/Strength/Tone Deficits: Generalized weakness, grossly 3/5 per functional assessment.  LLE Sensation: WFL - Light Touch Trunk Assessment Trunk Assessment: Kyphotic   Balance    End of  Session PT - End of Session Equipment Utilized During Treatment: Gait belt;Oxygen Activity Tolerance: Patient limited by fatigue;Treatment limited secondary to medical complications (Comment) Patient left: in chair;with call bell/phone within reach;with family/visitor present Nurse Communication: Mobility status  GP     Page, Meribeth Mattes 08/23/2012, 12:18 PM

## 2012-08-23 NOTE — Progress Notes (Signed)
Patient ID: Meredith Patton, female   DOB: [birth year], 71 y.o.   MRN: [id number]  [facility] PROGRESS NOTE  Meredith Patton WUJ:[id number] DOB: Mar 04, 1941 DOA: 08/16/2012 PCP: Marguarite Arbour, MD  Brief narrative: Pt is 71 year old female who initially presented to [facility] with abrupt onset of large volume of hemoptysis, resulting in respiratory failure 08/11/2012. CT chest done 08/02/12 at [facility] showed no pulmonary embolus with large subcarinal lymph nodes and probable right lower lobe mass worrisome for malignancy. Bronch washing at [facility] 08/12/12 confirmed malignant cells c/w NSCLC. Pt was transferred to [facility] 08/16/2012. Bronchoscopy was done and noted likely cancer obstruction BI, collapse RML, RLL, no bleeding. Pt was transferred to [facility] service 08/23/2012. Dr. Welton Flakes consulted for further recommendations. Pt insisting on going home but family is not in agreement as they believe she can not take care of herself.   Active Problems:  Major hemoptysis - secondary to likely NSCLC, hemoptysis is now resolved - Hg and Hct remain stable and pt is maintaining oxygen saturations at target range  - appreciate oncology input - will proceed with abdominal CT scan as recommended and PET scan will have to be done in an outpatient setting   Acute respiratory failure - secondary to extensive endobronchial tumor burden  - Bronchoscopy with complete obstruction of bronchus intermedius.  - pt has required intubation but was successfully extubated 10/18 and now maintaining oxygen saturation > 97% on RA  Lung mass - secondary to NSCLC, appreciate oncology input - recommendation is to proceed with chemotherapy and radiation therapy as well - pt has received one radiation therapy while in hospital and has tolerated the therapy well  Atrial fibrillation  - rate controlled - Cardizem started by PCCM and will continue it for now  Diastolic heart failure - 2 D ECHO performed at  [facility] 10/11  - LV systolic function is normal EF greater than 55%, right ventricle is moderately dilated. The right atrium is moderately dilated.  - this is now medically stable issue  Weight loss - certainly worrisome with new diagnosis of NSCLC - encouraged PO intake  HTN (hypertension) - stable - continue Diltiazem   Consultants:  Oncology - Dr. Welton Flakes  PCCM admitted under their care and now transferred to [facility] service (10/22)  PT evaluation   Procedures/Studies:  ETT: 10/10 >> extubated 10/18   Bronchoscopy on 08/16/2012: For evaluation of the mass, bleeding localization  1. RUL WNL  2. Bronchus intermedius , about 1.5 cm below RUL take off, mass, multiple white caps on posterior, complete obstruction and extrinsic compression  3. Lavage done at [facility], sent cytology, mild oozing post, 2 cc 1:1000 diluated in 9 cc, placed, no bleeding  4. Left   10/16 CT chest - centrally obstructing mass in the right subcarinal region, measuring approximately 3.8 x 5.1 cm, obstructing the bronchus intermedius with resultant heterogeneous collapse of the right middle and right lower lobes. Centrally obstructing mass in the superior segment right lower lobe with postobstructive collapse of the right middle and right lower lobes, and a small right pleural effusion. Findings are most consistent with primary bronchogenic carcinoma. Ct head neg for malignancy   08/17/12 : Simulation took place on 10/16 with 8 Gray in one fraction   08/18/12 - s/p XRT   Antibiotics:  Ceftaz 10/18 --> d/c   Cipro 10/19 -->   Micro:   10-19 sputum --> PSEUDOMONAS AERUGINOSA  Code Status: Full Family Communication: Pt at bedside Disposition  Plan: PT evaluation, will need to discuss with family the recommendations as pt wants to go home but family in disagreement   HPI/Subjective: No events overnight.   Objective: Filed Vitals:   08/22/12 2211 08/23/12 0524 08/23/12 0927 08/23/12  1517  BP: 132/79 123/73  120/72  Pulse: 82 73  70  Temp: 98.6 F (37 C) 98.1 F (36.7 C)  98.1 F (36.7 C)  TempSrc: Axillary Oral  Oral  Resp: 20 16  18   Height:      Weight:      SpO2: 100% 97% 90% 90%    Intake/Output Summary (Last 24 hours) at 08/23/12 1732 Last data filed at 08/23/12 1532  Gross per 24 hour  Intake    700 ml  Output      0 ml  Net    700 ml    Exam:   General:  Pt is alert, follows commands appropriately, not in acute distress  Cardiovascular: Regular rate and rhythm, S1/S2, no murmurs, no rubs, no gallops  Respiratory: Clear to auscultation bilaterally, bibasilar crackles   Abdomen: Soft, non tender, non distended, bowel sounds present, no guarding  Extremities: No edema, pulses DP and PT palpable bilaterally  Neuro: Grossly nonfocal  Data Reviewed: Basic Metabolic Panel:  Lab 08/23/12 1610 08/22/12 0352 08/21/12 0550 08/20/12 0415 08/19/12 0530 08/18/12 0440  NA 135 135 139 143 144 --  K 4.1 3.6 3.5 3.3* 4.0 --  CL 99 99 101 105 103 --  CO2 31 31 33* 32 35* --  GLUCOSE 93 106* 95 110* 131* --  BUN 9 12 16  28* 34* --  CREATININE 0.46* 0.47* 0.45* 0.49* 0.56 --  CALCIUM 8.4 8.1* 8.2* 8.2* 8.9 --  MG 2.1 2.2 -- -- 2.9* 2.9*  PHOS 3.4 2.9 -- -- 4.0 2.9   Liver Function Tests:  Lab 08/18/12 0440  AST 13  ALT 57*  ALKPHOS 97  BILITOT 0.7  PROT 7.2  ALBUMIN 2.9*   CBC:  Lab 08/23/12 0530 08/22/12 0500 08/20/12 0415 08/19/12 0530 08/18/12 0440 08/17/12 0824  WBC 6.7 7.5 9.4 12.9* 8.2 --  NEUTROABS -- -- -- -- -- 5.1  HGB 12.7 12.7 12.0 13.1 13.3 --  HCT 40.5 40.7 39.4 43.0 42.4 --  MCV 82.0 82.2 82.9 83.3 81.7 --  PLT 196 185 193 182 189 --   Cardiac Enzymes:  Lab 08/17/12 0252  CKTOTAL 21  CKMB 1.6  CKMBINDEX --  TROPONINI <0.30   CBG:  Lab 08/20/12 1224 08/20/12 0814 08/20/12 0352 08/19/12 2355 08/19/12 2014  GLUCAP 113* 93 108* 119* 119*    Recent Results (from the past 240 hour(s))  CULTURE, RESPIRATORY      Status: Normal   Collection Time   08/16/12  4:29 PM      Component Value Range Status Comment   Specimen Description TRACHEAL ASPIRATE   Final    Special Requests Immunocompromised   Final    Gram Stain     Final    Value: RARE WBC PRESENT,BOTH PMN AND MONONUCLEAR     RARE SQUAMOUS EPITHELIAL CELLS PRESENT     NO ORGANISMS SEEN   Culture FEW PSEUDOMONAS AERUGINOSA   Final    Report Status 08/20/2012 FINAL   Final    Organism ID, Bacteria PSEUDOMONAS AERUGINOSA   Final   MRSA PCR SCREENING     Status: Normal   Collection Time   08/17/12  4:58 AM      Component Value Range Status Comment  MRSA by PCR NEGATIVE  NEGATIVE Final      Scheduled Meds:   . albuterol  2.5 mg Nebulization Q6H  . antiseptic oral rinse  15 mL Mouth Rinse QID  . bimatoprost  1 drop Both Eyes QHS  . chlorhexidine  15 mL Mouth Rinse BID  . ciprofloxacin  750 mg Oral BID  . diltiazem  30 mg Oral Q8H  . feeding supplement  1 Container Oral TID BM  . heparin subcutaneous  5,000 Units Subcutaneous Q8H  . pantoprazole (PROTONIX) IV  40 mg Intravenous Q12H   Continuous Infusions:    Debbora Presto, MD  [facility] Pager [phone]  If 7PM-7AM, please contact night-coverage [email or web address] Password [email or web address] 08/23/2012, 5:32 PM   LOS: 7 days

## 2012-08-24 ENCOUNTER — Inpatient Hospital Stay (HOSPITAL_COMMUNITY): Payer: Medicare Other

## 2012-08-24 DIAGNOSIS — J411 Mucopurulent chronic bronchitis: Secondary | ICD-10-CM

## 2012-08-24 LAB — CBC
HCT: 40.5 % (ref 36.0–46.0)
MCHC: 31.6 g/dL (ref 30.0–36.0)
MCV: 81.5 fL (ref 78.0–100.0)
Platelets: 213 10*3/uL (ref 150–400)
RDW: 18.1 % — ABNORMAL HIGH (ref 11.5–15.5)

## 2012-08-24 LAB — BASIC METABOLIC PANEL
BUN: 7 mg/dL (ref 6–23)
Calcium: 8.4 mg/dL (ref 8.4–10.5)
Creatinine, Ser: 0.51 mg/dL (ref 0.50–1.10)
GFR calc Af Amer: >90 mL/min (ref >90)
GFR calc non Af Amer: >90 mL/min (ref >90)

## 2012-08-24 MED ORDER — TECHNETIUM TC 99M MEDRONATE IV KIT
25.0000 | PACK | Freq: Once | INTRAVENOUS | Status: AC | PRN
  Administered 2012-08-24: 24 via INTRAVENOUS

## 2012-08-24 MED ORDER — ZOLPIDEM TARTRATE 5 MG PO TABS
5.0000 mg | ORAL_TABLET | Freq: Every evening | ORAL | Status: DC | PRN
  Administered 2012-08-24: 5 mg via ORAL
  Filled 2012-08-24: qty 1

## 2012-08-24 MED ORDER — PANTOPRAZOLE SODIUM 40 MG PO TBEC: 40.0000 mg | DELAYED_RELEASE_TABLET | Freq: Two times a day (BID) | ORAL | Status: DC

## 2012-08-24 MED ORDER — DILTIAZEM HCL 30 MG PO TABS
30.0000 mg | ORAL_TABLET | Freq: Two times a day (BID) | ORAL | Status: DC
  Administered 2012-08-24 – 2012-08-25 (×2): 30 mg via ORAL
  Filled 2012-08-24 (×3): qty 1

## 2012-08-24 MED ORDER — PANTOPRAZOLE SODIUM 40 MG PO TBEC
40.0000 mg | DELAYED_RELEASE_TABLET | Freq: Two times a day (BID) | ORAL | Status: DC
  Administered 2012-08-25 (×2): 40 mg via ORAL
  Filled 2012-08-24 (×4): qty 1

## 2012-08-24 NOTE — Progress Notes (Signed)
CSW gave patient bed offers. Will follow for choice.  Kenan Moodie C. Sabine Tenenbaum MSW, LCSW [phone]  

## 2012-08-24 NOTE — Progress Notes (Signed)
Physical Therapy Treatment Patient Details Name: Meredith Patton MRN: [id number] DOB: 09-Jan-1941 Today's Date: 08/24/2012 Time: 4098-1191 PT Time Calculation (min): 18 min  PT Assessment / Plan / Recommendation Comments on Treatment Session  Pt very agitated and adamant about going home.  Pt very unsafe with all mobility and is not safe for D/C home.  Discusssed D/C with family and educated them that if she does go to SNF, she will have to participate with therapy, however if she goes home, she wiill be at high risk for a fall.     Follow Up Recommendations  Supervision/Assistance - 24 hour;Post acute inpatient     Does the patient have the potential to tolerate intense rehabilitation  No, Recommend SNF  Barriers to Discharge        Equipment Recommendations  Rolling walker with 5" wheels    Recommendations for Other Services OT consult  Frequency Min 3X/week   Plan Discharge plan remains appropriate    Precautions / Restrictions Precautions Precautions: Fall Precaution Comments: check HR Restrictions Weight Bearing Restrictions: No   Pertinent Vitals/Pain Generalized pain.      Mobility  Bed Mobility Bed Mobility: Supine to Sit;Sitting - Scoot to Edge of Bed Supine to Sit: 4: Min guard Details for Bed Mobility Assistance: Min/guard for safety.  Pt very agitated during session and requires max encouragement to participate with therapy.  Transfers Transfers: Sit to Stand;Stand to Sit Sit to Stand: 1: +2 Total assist;From elevated surface;With upper extremity assist;From bed Sit to Stand: Patient Percentage: 60% Stand to Sit: 1: +2 Total assist;With upper extremity assist;With armrests;To chair/3-in-1 Stand to Sit: Patient Percentage: 60% Details for Transfer Assistance: Assist to rise and steady with max cues for hand placement, safety and controlled descent when sitting.  Ambulation/Gait Ambulation/Gait Assistance: 1: +2 Total assist Ambulation/Gait: Patient  Percentage: 50% Ambulation Distance (Feet): 10 Feet Assistive device: Rolling walker Ambulation/Gait Assistance Details: Max cues for sequencing/technique with RW, safety, positioning inside RW.  Again, pt very agitated and adamant about ambulating "her way" instead of being safe.  Gait Pattern: Step-to pattern;Decreased stride length;Trunk flexed;Shuffle Gait velocity: decreased    Exercises     PT Diagnosis:    PT Problem List:   PT Treatment Interventions:     PT Goals Acute Rehab PT Goals PT Goal Formulation: With patient Time For Goal Achievement: 09/06/12 Potential to Achieve Goals: Good Pt will go Supine/Side to Sit: with supervision PT Goal: Supine/Side to Sit - Progress: Progressing toward goal Pt will go Sit to Stand: with supervision PT Goal: Sit to Stand - Progress: Progressing toward goal Pt will go Stand to Sit: with supervision PT Goal: Stand to Sit - Progress: Progressing toward goal Pt will Ambulate: 51 - 150 feet;with supervision;with least restrictive assistive device PT Goal: Ambulate - Progress: Progressing toward goal  Visit Information  Last PT Received On: 08/24/12 Assistance Needed: +2    Subjective Data  Subjective: I'm not playing this game.  Patient Stated Goal: to go home.   Cognition  Overall Cognitive Status: Appears within functional limits for tasks assessed/performed Arousal/Alertness: Awake/alert Orientation Level: Appears intact for tasks assessed Behavior During Session: [facility] for tasks performed Cognition - Other Comments: Pt was slow to process verbal commands.  Pts family member states that she has been somewhat confused since being extubated.     Balance     End of Session PT - End of Session Equipment Utilized During Treatment: Gait belt;Oxygen Activity Tolerance: Treatment limited secondary to agitation;Patient  limited by fatigue Patient left: in chair;with call bell/phone within reach;with family/visitor present Nurse  Communication: Mobility status   GP     Page, Meribeth Mattes 08/24/2012, 2:19 PM

## 2012-08-24 NOTE — Progress Notes (Addendum)
[facility]             Progress Note   Subjective: No complaints. Upset about not being able to go home.  Objective: Vital signs in last 24 hours: Temp:  [97.9 F (36.6 C)-99 F (37.2 C)] 98.2 F (36.8 C) (10/23 1525) Pulse Rate:  [85-98] 98  (10/23 1525) Resp:  [16-20] 20  (10/23 1525) BP: (114-136)/(65-98) 122/98 mmHg (10/23 1525) SpO2:  [90 %-99 %] 99 % (10/23 1525) Weight change:  Last BM Date: 08/22/12  Intake/Output from previous day: 10/22 0701 - 10/23 0700 In: 920 [P.O.:900; IV Piggyback:10] Out: 1395 [Urine:1395]     Physical Exam: General: Alert, awake, oriented x3, in no acute distress. HEENT: No bruits, no goiter. Heart: Regular rate and rhythm, without murmurs, rubs, gallops. Lungs: Clear to auscultation bilaterally. Abdomen: Soft, nontender, nondistended, positive bowel sounds. Extremities: No clubbing cyanosis or edema with positive pedal pulses. Neuro: Grossly intact, nonfocal.    Lab Results: Basic Metabolic Panel:  Basename 08/24/12 0545 08/23/12 0530 08/22/12 0352  NA 135 135 --  K 3.7 4.1 --  CL 100 99 --  CO2 30 31 --  GLUCOSE 122* 93 --  BUN 7 9 --  CREATININE 0.51 0.46* --  CALCIUM 8.4 8.4 --  MG -- 2.1 2.2  PHOS -- 3.4 2.9   CBC:  Basename 08/24/12 0545 08/23/12 0530  WBC 7.6 6.7  NEUTROABS -- --  HGB 12.8 12.7  HCT 40.5 40.5  MCV 81.5 82.0  PLT 213 196    Recent Results (from the past 240 hour(s))  CULTURE, RESPIRATORY     Status: Normal   Collection Time   08/16/12  4:29 PM      Component Value Range Status Comment   Specimen Description TRACHEAL ASPIRATE   Final    Special Requests Immunocompromised   Final    Gram Stain     Final    Value: RARE WBC PRESENT,BOTH PMN AND MONONUCLEAR     RARE SQUAMOUS EPITHELIAL CELLS PRESENT     NO ORGANISMS SEEN   Culture FEW PSEUDOMONAS AERUGINOSA   Final    Report Status 08/20/2012 FINAL   Final    Organism ID, Bacteria PSEUDOMONAS AERUGINOSA   Final   MRSA PCR  SCREENING     Status: Normal   Collection Time   08/17/12  4:58 AM      Component Value Range Status Comment   MRSA by PCR NEGATIVE  NEGATIVE Final     Studies/Results: Nm Bone Scan Whole Body  08/24/2012  *RADIOLOGY REPORT*  Clinical Data: Lung cancer, staging for bone metastases, left posterior side and low back pain  NUCLEAR MEDICINE WHOLE BODY BONE SCINTIGRAPHY  Technique:  Whole body anterior and posterior images were obtained approximately 3 hours after intravenous injection of radiopharmaceutical.  Radiopharmaceutical: CURIE TC-MDP TECHNETIUM TC 42M MEDRONATE IV KIT  Comparison: None Correlation:  CT chest 08/17/2012, CT abdomen and pelvis 08/23/2012  Findings: Photopenic regions at knees bilaterally likely reflect bilateral total knee arthroplasties. Mild bladder retention of tracer. No abnormal foci of osseous tracer accumulation identified to suggest osseous metastatic disease. Urinary tract and soft tissue distribution of tracer otherwise normal.  IMPRESSION: Prior bilateral total knee replacements. No scintigraphic evidence of osseous metastatic disease. Specifically, no findings are seen to account for patient's symptoms.   Original Report Authenticated By: Lollie Marrow, M.D.    Ct Abdomen Pelvis W Contrast  08/24/2012  *RADIOLOGY REPORT*  Clinical Data: Lung cancer staging.  CT ABDOMEN AND PELVIS WITH CONTRAST  Technique:  Multidetector CT imaging of the abdomen and pelvis was performed following the standard protocol during bolus administration of intravenous contrast.  Contrast: OMNIPAQUE IOHEXOL 300 MG/ML  SOLN  Comparison: 08/17/2012  Findings: Atelectatic right middle lobe and right lower lobe noted with moderate sized right pleural effusion mildly increased in size compared the prior.  Mild enhancement node along the right pleural surface favoring an exudative effusion.  There is mild atelectasis posteriorly in the left lower lobe.  Epicardial lymph node short axis 5  mm, image 12 of series 2.  Clips noted at the gastroesophageal junction.  There is some hypodensity in the atelectatic right lower lobe posteriorly, of uncertain significance.  Mild intrahepatic biliary dilatation noted.  Common bile duct 1.3 cm in diameter.  Gallbladder absent.  I do not observe a liver mass.  The spleen and pancreas appear normal.  Small mass-like appearance of medial limb of the left adrenal gland at ten millimeters, nonspecific for adenoma versus other lesion.  Right adrenal gland normal.  4.5 cm right kidney upper pole Bosniak category one cyst.  Left kidney unremarkable.  Aortoiliac atherosclerotic calcification noted.  Suspected intraluminal lipoma in the transverse duodenum, 1.6 cm on image 54 of series 2.  Trace ascites noted in the right pericolic gutter and along the inferior edge of the right liver.  Right external iliac node short axis 0.9 cm, image 76 of series 2.  Sigmoid diverticulosis noted without diverticulitis.  Urinary bladder unremarkable.  Uterus absent.  No pathologic adenopathy in the abdomen noted.  Postoperative findings noted in the lower lumbar spine.  Degenerative loss articular space noted in both hips.  Lower thoracic spondylosis noted.  Multilevel lumbar facet arthropathy is noted, with intervertebral spurring causing right foraminal impingement L5-S1.  IMPRESSION:  1.  Atelectatic right lower and middle lobes, with right pleural effusion with enhancing margins favoring exudative pleural effusion.  Malignant pleural effusion not excluded.  Triangular hypoperfusion in the right lower lobe is nonspecific and can sometimes be seen in the setting of collapse causing vascular constriction, although the subsegmental pulmonary embolus in the right lower lobe is not totally excluded. 2.  Small mass of the medial limb of the left adrenal gland, technically nonspecific for malignancy versus adenoma. This could be further characterized with MRI or PET CT. 3. Intrahepatic and  extrahepatic biliary dilatation.  At least a portion of this may be secondary to prior cholecystectomy causing physiologic response. 4.  Atherosclerosis. 5.  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 6.  Trace ascites. 7.  Sigmoid diverticulosis. 8.  Lumbar spondylosis with prior postoperative findings in the lower lumbar spine.  Right foraminal impingement at L5-S1 due to spurring.   Original Report Authenticated By: Dellia Cloud, M.D.     Medications: Scheduled Meds:    . albuterol  2.5 mg Nebulization Q6H WA  . antiseptic oral rinse  15 mL Mouth Rinse QID  . bimatoprost  1 drop Both Eyes QHS  . chlorhexidine  15 mL Mouth Rinse BID  . ciprofloxacin  750 mg Oral BID  . diltiazem  30 mg Oral Q12H  . feeding supplement  1 Container Oral TID BM  . heparin subcutaneous  5,000 Units Subcutaneous Q8H  . ipratropium  0.5 mg Nebulization Q6H WA  . pantoprazole (PROTONIX) IV  40 mg Intravenous Q12H  . zolpidem  5 mg Oral Once  . DISCONTD: albuterol  2.5 mg Nebulization Q6H  . DISCONTD:  diltiazem  30 mg Oral Q8H   Continuous Infusions:  PRN Meds:.albuterol, ALPRAZolam, iohexol, oxyCODONE, sodium chloride, technetium medronate  Assessment/Plan:  Active Problems:  Major hemoptysis  Acute respiratory failure  Lung mass  Atrial fibrillation  Diastolic heart failure  Weight loss  HTN (hypertension)  Cancer of bronchus and lung  Atelectasis of right lung  Purulent bronchitis    Hemoptysis -Resolved. -2/2 NSCLC.  Lung Cancer -Further treatment recommendations to be directed by oncology.  Acute Respiratory Failure -Resolved. -2/2 major hemoptysis. -Extubated 10/18. -Has not been requiring oxygen.  Atrial Fibrillation -Rate controlled. -Had a 2.10 pause on tele. -Will decrease cardizem from 30 TID to BID. -No warfarin given hemoptysis.  Purulent Bronchitis -Pseudomonas in sputum. -Plan is for 14 days of PO cipro.  Disposition -Plan is for ST-SNF  in am. -Bed offers have been presented to patient. -Follow up with oncology.    Time spent coordinating care: 35 minutes.   LOS: 8 days   [facility] Pager: [phone] 08/24/2012, 4:02 PM

## 2012-08-24 NOTE — Progress Notes (Signed)
Pt had 2.10 sec pause. Pt checked and VSS, asymptomatic. MD made aware. MD will come to floor to see strip and make any necessary changes. Will continue to monitor. Julio Sicks RN

## 2012-08-24 NOTE — Progress Notes (Signed)
  Radiation Oncology         [phone] ________________________________  Name: Meredith Patton MRN: [id number]  Date: 08/18/2012  DOB: 06-Sep-1941  End of Treatment Note  Diagnosis:  Right lung cancer  Indication for treatment:  Palliative       Radiation treatment dates:   08/18/2012  Site/dose:   Right lung/ 8 Gy in 1 fraction  Beams/energy:   Opposed obliques / 18 MV photons  Narrative: The patient tolerated radiation treatment relatively well.   Her bleeding stopped and she was able to be extubated.  Plan: The patient has completed radiation treatment. The patient will return to radiation oncology clinic for routine followup in one month. I advised them to call or return sooner if they have any questions or concerns related to their recovery or treatment.  ------------------------------------------------  Lurline Hare, MD

## 2012-08-25 DIAGNOSIS — J411 Mucopurulent chronic bronchitis: Secondary | ICD-10-CM

## 2012-08-25 DIAGNOSIS — C3430 Malignant neoplasm of lower lobe, unspecified bronchus or lung: Secondary | ICD-10-CM

## 2012-08-25 MED ORDER — CIPROFLOXACIN HCL 750 MG PO TABS: 750.0000 mg | ORAL_TABLET | Freq: Two times a day (BID) | ORAL | Status: AC

## 2012-08-25 MED ORDER — ALPRAZOLAM 0.5 MG PO TABS: 0.5000 mg | ORAL_TABLET | Freq: Three times a day (TID) | ORAL | Status: AC | PRN

## 2012-08-25 NOTE — Progress Notes (Addendum)
CSW met with patient and gave bed offers. Patient agreeable to [facility] in [place].  Lilley Hubble C. Reuel Lamadrid MSW, LCSW [phone] Patient cleared for discharge. Packet copied and placed in [place]. ptar called for transportation.  Bertice Risse C. Annabelle Rexroad MSW, LCSW [phone]

## 2012-08-25 NOTE — Progress Notes (Signed)
Subjective: Events since 10/22 noted..Bone scan on 10/23 negative for osseous involvement. CT of the abdomen and pelvis on 10/23 shows slight increase in right pleural effusion, 5 mm epicardial node, small mass at  The left adrenal gland, technically nonspecific for malignancy versus adenoma. trace ascites. No other abnormalities noted.  Awaiting discharge to SNF, possibly today if arrangements can be finalized.  Objective: Vital signs in last 24 hours: BP 156/87  Pulse 69  Temp 98.3 F (36.8 C) (Oral)  Resp 19  Ht 5\' 10"  (1.778 m)  Wt 206 lb 9.1 oz (93.7 kg)  BMI 29.64 kg/m2  SpO2 93%   Physical Exam: 71 y.o.  in no acute distress  A. and Ox3 HEENT: Sclera anicteric. Oral cavity without thrush or lesions. Neck supple.no cervical or supraclavicular adenopathy  Lungs:decreased breath sounds at the bases. No wheezing, rhonchi or rales.  CV regular rhythm with ectopic beats normal S1-S2, no murmur , rubs or gallops Abdomen soft nontender , bowel sounds x4  no hepatosplenomegaly GU/rectal: deferred. Extremities: no clubbing cyanosis .trace edema bilaterally. No bruising or petechial rash     Lab Results: Labs:  CBC   Lab 08/24/12 0545 08/23/12 0530 08/22/12 0500 08/20/12 0415 08/19/12 0530  WBC 7.6 6.7 7.5 9.4 12.9*  HGB 12.8 12.7 12.7 12.0 13.1  HCT 40.5 40.5 40.7 39.4 43.0  PLT 213 196 185 193 182  MCV 81.5 82.0 82.2 82.9 83.3  MCH 25.8* 25.7* 25.7* 25.3* 25.4*  MCHC 31.6 31.4 31.2 30.5 30.5  RDW 18.1* 18.0* 17.9* 18.4* 18.2*  LYMPHSABS -- -- -- -- --  MONOABS -- -- -- -- --  EOSABS -- -- -- -- --  BASOSABS -- -- -- -- --  BANDABS -- -- -- -- --    CMP    Lab 08/24/12 0545 08/23/12 0530 08/22/12 0352 08/21/12 0550 08/20/12 0415 08/19/12 0530  NA 135 135 135 139 143 --  K 3.7 4.1 3.6 3.5 3.3* --  CL 100 99 99 101 105 --  CO2 30 31 31  33* 32 --  GLUCOSE 122* 93 106* 95 110* --  BUN 7 9 12 16  28* --  CREATININE 0.51 0.46* 0.47* 0.45* 0.49* --  CALCIUM 8.4 8.4  8.1* 8.2* 8.2* --  MG -- 2.1 2.2 -- -- 2.9*  AST -- -- -- -- -- --  ALT -- -- -- -- -- --  ALKPHOS -- -- -- -- -- --  BILITOT -- -- -- -- -- --        Component Value Date/Time   BILITOT 0.7 08/18/2012 0440     AImaging Studies:  Nm Bone Scan Whole Body  08/24/2012  Comparison: None Correlation:  CT chest 08/17/2012, CT abdomen and pelvis 08/23/2012  Findings: Photopenic regions at knees bilaterally likely reflect bilateral total knee arthroplasties. Mild bladder retention of tracer. No abnormal foci of osseous tracer accumulation identified to suggest osseous metastatic disease. Urinary tract and soft tissue distribution of tracer otherwise normal.  IMPRESSION: Prior bilateral total knee replacements. No scintigraphic evidence of osseous metastatic disease. Specifically, no findings are seen to account for patient's symptoms.   Original Report Authenticated By: Lollie Marrow, M.D.    Ct Abdomen Pelvis W Contrast  08/24/2012  Findings: Atelectatic right middle lobe and right lower lobe noted with moderate sized right pleural effusion mildly increased in size compared the prior.  Mild enhancement node along the right pleural surface favoring an exudative effusion.  There is mild atelectasis posteriorly in the left  lower lobe.  Epicardial lymph node short axis 5 mm, image 12 of series 2.  Clips noted at the gastroesophageal junction.  There is some hypodensity in the atelectatic right lower lobe posteriorly, of uncertain significance.  Mild intrahepatic biliary dilatation noted.  Common bile duct 1.3 cm in diameter.  Gallbladder absent.  I do not observe a liver mass.  The spleen and pancreas appear normal.  Small mass-like appearance of medial limb of the left adrenal gland at ten millimeters, nonspecific for adenoma versus other lesion.  Right adrenal gland normal.  4.5 cm right kidney upper pole Bosniak category one cyst.  Left kidney unremarkable.  Aortoiliac atherosclerotic calcification  noted.  Suspected intraluminal lipoma in the transverse duodenum, 1.6 cm on image 54 of series 2.  Trace ascites noted in the right pericolic gutter and along the inferior edge of the right liver.  Right external iliac node short axis 0.9 cm, image 76 of series 2.  Sigmoid diverticulosis noted without diverticulitis.  Urinary bladder unremarkable.  Uterus absent.  No pathologic adenopathy in the abdomen noted.  Postoperative findings noted in the lower lumbar spine.  Degenerative loss articular space noted in both hips.  Lower thoracic spondylosis noted.  Multilevel lumbar facet arthropathy is noted, with intervertebral spurring causing right foraminal impingement L5-S1.  IMPRESSION:  1.  Atelectatic right lower and middle lobes, with right pleural effusion with enhancing margins favoring exudative pleural effusion.  Malignant pleural effusion not excluded.  Triangular hypoperfusion in the right lower lobe is nonspecific and can sometimes be seen in the setting of collapse causing vascular constriction, although the subsegmental pulmonary embolus in the right lower lobe is not totally excluded. 2.  Small mass of the medial limb of the left adrenal gland, technically nonspecific for malignancy versus adenoma. This could be further characterized with MRI or PET CT. 3. Intrahepatic and extrahepatic biliary dilatation.  At least a portion of this may be secondary to prior cholecystectomy causing physiologic response. 4.  Atherosclerosis. 5.  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 6.  Trace ascites. 7.  Sigmoid diverticulosis. 8.  Lumbar spondylosis with prior postoperative findings in the lower lumbar spine.  Right foraminal impingement at L5-S1 due to spurring.   Original Report Authenticated By: Dellia Cloud, M.D.      Assessment/Plan:  Assessment: 71 y.o.  #1 hemoptysis secondary to obstructing lung lesion now resolved patient did undergo one dose of radiation therapy.   #2  non-small cell lung carcinoma. Patient will need further treatments, discussed with the family. However patient at this time does not want to talk about this. Her main concern is that she wants to go home.   Plan:   #1 Patient would be a good candidate for chemotherapy concurrently with radiation therapy or alone,once discharged from hospital,  discussed this with the family previously by Dr. Welton Flakes. Patient to be contacted for follow up appointment at our center.  #2 further testing, including PET scan to be performed as an outpatient.  #3 Disposition: patient to be discharged within the next 24 hours if SNF bed arrangements can be made. If she remains in hospital, will continue to follow Ms. Maudry Diego with you       LOS: 9 days   [facility] E 08/25/2012, 7:54 AM  ATTENDING'S ATTESTATION:  I personally reviewed patient's chart, examined patient myself, formulated the treatment plan as followed.    Patient will need a follow up with me in the my clinic. We will call the  patient to set up appointments.  Drue Second, MD Medical/Oncology [facility] [phone] (beeper) [phone] (Office)  08/25/2012, 7:08 PM

## 2012-08-25 NOTE — Progress Notes (Signed)
Alert and oriented. Vital signs stable. IJ removed by IV team. Telemetry D/C'ed. Discharge instructions and prescriptions reported to [facility].  Left floor via [facility]AR. Discharged to SNF.  Heather Roberts 08/25/2012 1:47 PM

## 2012-08-25 NOTE — Discharge Summary (Addendum)
Physician Discharge Summary  Patient ID: Meredith Patton MRN: [id number] DOB/AGE: 70-Jun-1942 70 y.o.  Admit date: 08/16/2012 Discharge date: 08/25/2012  Primary Care Physician:  Marguarite Arbour, MD   Discharge Diagnoses:    Active Problems:  Major hemoptysis  Acute respiratory failure  Lung mass  Atrial fibrillation  Diastolic heart failure  Weight loss  HTN (hypertension)  Cancer of bronchus and lung  Atelectasis of right lung  Purulent bronchitis      Medication List     As of 08/25/2012 11:59 AM    STOP taking these medications         ADCIRCA 20 MG Tabs   Generic drug: Tadalafil (PAH)      digoxin 0.125 MG tablet   Commonly known as: LANOXIN      furosemide 40 MG tablet   Commonly known as: LASIX      isosorbide mononitrate 60 MG 24 hr tablet   Commonly known as: IMDUR      LETAIRIS 5 MG tablet   Generic drug: ambrisentan      metoprolol succinate 100 MG 24 hr tablet   Commonly known as: TOPROL-XL      ondansetron 8 MG tablet   Commonly known as: ZOFRAN      oxyCODONE-acetaminophen 5-325 MG per tablet   Commonly known as: PERCOCET/ROXICET      potassium chloride 10 MEQ CR capsule   Commonly known as: MICRO-K      ramipril 5 MG capsule   Commonly known as: ALTACE      traMADol 50 MG tablet   Commonly known as: ULTRAM      warfarin 7.5 MG tablet   Commonly known as: COUMADIN      TAKE these medications         albuterol 108 (90 BASE) MCG/ACT inhaler   Commonly known as: PROVENTIL HFA;VENTOLIN HFA   Inhale 2 puffs into the lungs 4 (four) times daily.      ALPRAZolam 0.5 MG tablet   Commonly known as: XANAX   Take 1 tablet (0.5 mg total) by mouth 3 (three) times daily as needed. For anxiety      bimatoprost 0.03 % ophthalmic solution   Commonly known as: LUMIGAN   Place 1 drop into both eyes at bedtime.      ciprofloxacin 750 MG tablet   Commonly known as: CIPRO   Take 1 tablet (750 mg total) by mouth 2 (two) times daily.     docusate sodium 100 MG capsule   Commonly known as: COLACE   Take 100 mg by mouth 2 (two) times daily.      Fluticasone-Salmeterol 250-50 MCG/DOSE Aepb   Commonly known as: ADVAIR   Inhale 1 puff into the lungs every 12 (twelve) hours.      pantoprazole 40 MG tablet   Commonly known as: PROTONIX   Take 40 mg by mouth 2 (two) times daily.      SLOW FE 142 (45 FE) MG Tbcr   Generic drug: Ferrous Sulfate   Take 45 mg by mouth daily.      tiotropium 18 MCG inhalation capsule   Commonly known as: SPIRIVA   Place 18 mcg into inhaler and inhale daily.         Disposition and Follow-up:  Will be discharged to SNF today in stable and improved condition. Will need follow up with oncology (Dr. Welton Flakes) to discuss further treatment plans. They will arrange appointment for the patient.  Consults: Oncology, Dr. Welton Flakes  Significant Diagnostic Studies:  Nm Bone Scan Whole Body  08/24/2012  *RADIOLOGY REPORT*  Clinical Data: Lung cancer, staging for bone metastases, left posterior side and low back pain  NUCLEAR MEDICINE WHOLE BODY BONE SCINTIGRAPHY  Technique:  Whole body anterior and posterior images were obtained approximately 3 hours after intravenous injection of radiopharmaceutical.  Radiopharmaceutical: CURIE TC-MDP TECHNETIUM TC 54M MEDRONATE IV KIT  Comparison: None Correlation:  CT chest 08/17/2012, CT abdomen and pelvis 08/23/2012  Findings: Photopenic regions at knees bilaterally likely reflect bilateral total knee arthroplasties. Mild bladder retention of tracer. No abnormal foci of osseous tracer accumulation identified to suggest osseous metastatic disease. Urinary tract and soft tissue distribution of tracer otherwise normal.  IMPRESSION: Prior bilateral total knee replacements. No scintigraphic evidence of osseous metastatic disease. Specifically, no findings are seen to account for patient's symptoms.   Original Report Authenticated By: Lollie Marrow, M.D.    Ct Abdomen Pelvis  W Contrast  08/24/2012  *RADIOLOGY REPORT*  Clinical Data: Lung cancer staging.  CT ABDOMEN AND PELVIS WITH CONTRAST  Technique:  Multidetector CT imaging of the abdomen and pelvis was performed following the standard protocol during bolus administration of intravenous contrast.  Contrast: OMNIPAQUE IOHEXOL 300 MG/ML  SOLN  Comparison: 08/17/2012  Findings: Atelectatic right middle lobe and right lower lobe noted with moderate sized right pleural effusion mildly increased in size compared the prior.  Mild enhancement node along the right pleural surface favoring an exudative effusion.  There is mild atelectasis posteriorly in the left lower lobe.  Epicardial lymph node short axis 5 mm, image 12 of series 2.  Clips noted at the gastroesophageal junction.  There is some hypodensity in the atelectatic right lower lobe posteriorly, of uncertain significance.  Mild intrahepatic biliary dilatation noted.  Common bile duct 1.3 cm in diameter.  Gallbladder absent.  I do not observe a liver mass.  The spleen and pancreas appear normal.  Small mass-like appearance of medial limb of the left adrenal gland at ten millimeters, nonspecific for adenoma versus other lesion.  Right adrenal gland normal.  4.5 cm right kidney upper pole Bosniak category one cyst.  Left kidney unremarkable.  Aortoiliac atherosclerotic calcification noted.  Suspected intraluminal lipoma in the transverse duodenum, 1.6 cm on image 54 of series 2.  Trace ascites noted in the right pericolic gutter and along the inferior edge of the right liver.  Right external iliac node short axis 0.9 cm, image 76 of series 2.  Sigmoid diverticulosis noted without diverticulitis.  Urinary bladder unremarkable.  Uterus absent.  No pathologic adenopathy in the abdomen noted.  Postoperative findings noted in the lower lumbar spine.  Degenerative loss articular space noted in both hips.  Lower thoracic spondylosis noted.  Multilevel lumbar facet arthropathy is noted,  with intervertebral spurring causing right foraminal impingement L5-S1.  IMPRESSION:  1.  Atelectatic right lower and middle lobes, with right pleural effusion with enhancing margins favoring exudative pleural effusion.  Malignant pleural effusion not excluded.  Triangular hypoperfusion in the right lower lobe is nonspecific and can sometimes be seen in the setting of collapse causing vascular constriction, although the subsegmental pulmonary embolus in the right lower lobe is not totally excluded. 2.  Small mass of the medial limb of the left adrenal gland, technically nonspecific for malignancy versus adenoma. This could be further characterized with MRI or PET CT. 3. Intrahepatic and extrahepatic biliary dilatation.  At least a portion of this may be secondary to prior cholecystectomy causing  physiologic response. 4.  Atherosclerosis. 5.  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 6.  Trace ascites. 7.  Sigmoid diverticulosis. 8.  Lumbar spondylosis with prior postoperative findings in the lower lumbar spine.  Right foraminal impingement at L5-S1 due to spurring.   Original Report Authenticated By: Dellia Cloud, M.D.     Brief H and P: For complete details please refer to admission H and P, but in brief  Patient is a 71 year old female presented to [facility] presented with abrupt onset of large volume of hemoptysis, resulting in respiratory failure 08/11/2012. Transferred to [facility] 08/16/2012. Bronch with likely cancer obstruction BI, collapse RML, RLL, no bleeding. She was initially admitted to the [facility] service and transferred to [facility] on 10/19 for further care.     Hospital Course:  Active Problems:  Major hemoptysis  Acute respiratory failure  Lung mass  Atrial fibrillation  Diastolic heart failure  Weight loss  HTN (hypertension)  Cancer of bronchus and lung  Atelectasis of right lung  Purulent bronchitis   Hemoptysis  -Resolved.  -2/2  NSCLC.   Lung Cancer  -Further treatment recommendations to be directed by oncology.   Acute Respiratory Failure  -Resolved.  -2/2 major hemoptysis.  -Extubated 10/18.  -Has not been requiring oxygen.   Atrial Fibrillation  -Rate controlled.  -Had a 2.10 pause on tele.  -I have discontinued her cardizem as she has continued to have frequent pauses of up to 2.4 seconds on telemtry. -No warfarin given hemoptysis.   Purulent Bronchitis  -Pseudomonas in sputum.  -Plan is for 14 days of PO cipro.  -At time of DC has 8 days remaining.     Time spent on Discharge: Greater than 30 minutes.  SignedChaya Jan [facility] Pager: [phone] 08/25/2012, 11:59 AM

## 2012-08-29 ENCOUNTER — Telehealth: Payer: Self-pay | Admitting: *Deleted

## 2012-08-29 LAB — GLUCOSE, CAPILLARY

## 2012-08-29 NOTE — Telephone Encounter (Signed)
The primary will be her family doctor.  We will discuss her care plan when she sees me on 11/8

## 2012-08-29 NOTE — Telephone Encounter (Signed)
Pt's daughter Verlon Au called states " Mom went to [place] then [facility] and now she's at Rehab at [facility]. Dr. Welton Flakes said she would be taking care of mom's case, so who is mom's PCP. Would that be Dr. Welton Flakes or Dr. Mayo Ao? She needs her medicine like the antibiotics, stool softeners,etc. Dr. Lamount Cohen is the Attending MD at the Rehab and he said he can only go off what was sent from the hospital. Also, what are the results of the cancer biopsy?" Informed Verlon Au I wIll review with MD and call her with additional information. No further questions. Pt has f/u 11/8 lab/md

## 2012-08-30 NOTE — Telephone Encounter (Signed)
Called [name] per MD, The primary will be her family doctor. MD will discuss care plan with pt/family on 11/813. Requested call back to confirm message received.

## 2012-09-02 ENCOUNTER — Ambulatory Visit: Payer: Self-pay | Admitting: Internal Medicine

## 2012-09-09 ENCOUNTER — Telehealth: Payer: Self-pay | Admitting: *Deleted

## 2012-09-09 ENCOUNTER — Encounter: Payer: Self-pay | Admitting: Oncology

## 2012-09-09 ENCOUNTER — Ambulatory Visit: Payer: Medicare Other

## 2012-09-09 ENCOUNTER — Ambulatory Visit (HOSPITAL_BASED_OUTPATIENT_CLINIC_OR_DEPARTMENT_OTHER): Payer: Medicare Other | Admitting: Oncology

## 2012-09-09 VITALS — BP 160/89 | HR 66 | Temp 97.7°F | Resp 20 | Ht 70.0 in | Wt 209.9 lb

## 2012-09-09 DIAGNOSIS — R918 Other nonspecific abnormal finding of lung field: Secondary | ICD-10-CM

## 2012-09-09 DIAGNOSIS — C3430 Malignant neoplasm of lower lobe, unspecified bronchus or lung: Secondary | ICD-10-CM

## 2012-09-09 DIAGNOSIS — C3490 Malignant neoplasm of unspecified part of unspecified bronchus or lung: Secondary | ICD-10-CM

## 2012-09-09 NOTE — Progress Notes (Signed)
Checked in new patient. No financial issues. °

## 2012-09-09 NOTE — Progress Notes (Signed)
OFFICE PROGRESS NOTE  CC  SPARKS,JEFFREY D, MD [address]   [place] [address]  DIAGNOSIS: 71 year old female with non-small cell lung carcinoma  PRIOR THERAPY:  #1 patient originally presented to [facility] with abrupt onset of large bowel hemoptysis resulting in respiratory failure on 08/11/2012. She was transferred to [facility] on 08/16/2012. She had a bronchoscopy that showed obstruction and collapse a right middle lobe right lower lobe but no bleeding. Her pathology showed a non-small cell lung carcinoma.  #2 because of hemoptysis patient did require ventilation and she subsequently received urgent palliative radiation therapy on 08/18/2012 to go right lung 8 gray in 1 fraction. The bleeding stopped and she was extubated and transferred to oncology floor 6.  #3 patient was seen by me in consultation. At that time recommendation was systemic chemotherapy. Up-to-date patient has not agreed to treatment  CURRENT THERAPY: Patient being evaluated for palliative chemotherapy  INTERVAL HISTORY: Meredith Patton 71 y.o. female returns for office visit after being discharged from the hospital. Clinically she seems to be doing well. She is accompanied by her 2 daughters and one granddaughter. Patient is sitting comfortably in her wheelchair. She is awake alert. She has not had any hemoptysis since being discharged from the hospital. Patient is at home. Although it was recommended that she go to a skilled nursing facility. But patient declined. She is still weak and tired and fatigued. She does have some pain off and on. She is taking Percocets for this. She is denying any nausea vomiting no hemoptysis or hematemesis. She has no abdominal pain. Patient at this time is uncertain what she wants to do as far as treatment for her cancer is concerned. We did discuss this extensively. Certainly her family wants her to be treated with chemotherapy appear  MEDICAL  HISTORY: Past Medical History  Diagnosis Date  . Chronic atrial fibrillation   . PAH (pulmonary artery hypertension)   . Oxygen dependent   . Diastolic heart failure   . HTN (hypertension)   . Anxiety   . Gastric ulcer   . CVA (cerebral infarction)   . COPD (chronic obstructive pulmonary disease)   . Stroke   . CHF (congestive heart failure)   . Lung cancer     possible cancer diagnosis  . Hyperlipidemia   . Hiatal hernia     ALLERGIES:  is allergic to gabapentin and morphine and related.  MEDICATIONS:  Current Outpatient Prescriptions  Medication Sig Dispense Refill  . albuterol (PROVENTIL HFA;VENTOLIN HFA) 108 (90 BASE) MCG/ACT inhaler Inhale 2 puffs into the lungs 4 (four) times daily.      Marland Kitchen ALPRAZolam (XANAX) 0.5 MG tablet Take 1 tablet (0.5 mg total) by mouth 3 (three) times daily as needed. For anxiety  30 tablet  0  . bimatoprost (LUMIGAN) 0.03 % ophthalmic solution Place 1 drop into both eyes at bedtime.      . docusate sodium (COLACE) 100 MG capsule Take 100 mg by mouth 2 (two) times daily.      . Fluticasone-Salmeterol (ADVAIR) 250-50 MCG/DOSE AEPB Inhale 1 puff into the lungs every 12 (twelve) hours.      Marland Kitchen HYDROcodone-acetaminophen (NORCO/VICODIN) 5-325 MG per tablet Take 1 tablet by mouth every 6 (six) hours as needed.      . pantoprazole (PROTONIX) 40 MG tablet Take 40 mg by mouth 2 (two) times daily.      Marland Kitchen tiotropium (SPIRIVA) 18 MCG inhalation capsule Place 18 mcg into inhaler  and inhale daily.      . Ferrous Sulfate (SLOW FE) 142 (45 FE) MG TBCR Take 45 mg by mouth daily.        SURGICAL HISTORY:  Past Surgical History  Procedure Date  . Abdominal hysterectomy   . Cholecystectomy   . Replacement total knee bilateral   . Lumbar spur removal     REVIEW OF SYSTEMS:  Pertinent items are noted in HPI.   HEALTH MAINTENANCE: PHYSICAL EXAMINATION: Blood pressure 160/89, pulse 66, temperature 97.7 F (36.5 C), resp. rate 20, height 5\' 10"  (1.778 m), weight  209 lb 14.4 oz (95.21 kg). Body mass index is 30.12 kg/(m^2). ECOG PERFORMANCE STATUS: 1 - Symptomatic but completely ambulatory   General appearance: alert, appears older than stated age, distracted, fatigued and uncooperative Lymph nodes: Cervical, supraclavicular, and axillary nodes normal. Resp: Distant breath sounds bilaterally Cardio: regular rate and rhythm Extremities: No clubbing or edema   LABORATORY DATA: Lab Results  Component Value Date   WBC 7.6 08/24/2012   HGB 12.8 08/24/2012   HCT 40.5 08/24/2012   MCV 81.5 08/24/2012   PLT 213 08/24/2012      Chemistry      Component Value Date/Time   NA 135 08/24/2012 0545   K 3.7 08/24/2012 0545   CL 100 08/24/2012 0545   CO2 30 08/24/2012 0545   BUN 7 08/24/2012 0545   CREATININE 0.51 08/24/2012 0545      Component Value Date/Time   CALCIUM 8.4 08/24/2012 0545   ALKPHOS 97 08/18/2012 0440   AST 13 08/18/2012 0440   ALT 57* 08/18/2012 0440   BILITOT 0.7 08/18/2012 0440       RADIOGRAPHIC STUDIES:  Ct Head W Wo Contrast  2012-09-07  *RADIOLOGY REPORT*  Clinical Data:  Chronic atrial fibrillation and hypertension.  Lung mass.  CT HEAD WITHOUT AND WITH CONTRAST  Technique: Contiguous axial images were obtained from the base of the skull through the vertex without and with intravenous contrast  Contrast: OMNIPAQUE IOHEXOL 300 MG/ML  SOLN  Comparison:   None.  Findings:  There is no evidence for acute infarction, intracranial hemorrhage, mass lesion, hydrocephalus, or extra-axial fluid.  Mild age related atrophy.  Slight chronic microvascular ischemic change. Following the administration of contrast, there is no abnormal enhancement of the brain or meninges.  The major intracranial vascular structures grossly patent.  Vascular calcification is noted in the carotid siphon region.  There are no destructive osseous lesions.  No acute sinus or mastoid fluid.  IMPRESSION: Unremarkable CT brain without and with contrast.  Mild age related changes.  No CT evidence for intracranial metastatic disease.   Original Report Authenticated By: Elsie Stain, M.D.    Ct Chest W Contrast  2012/09/07  *RADIOLOGY REPORT*  Clinical Data: Staging lung mass.  Hemoptysis.  Respiratory distress.  Pain, cough, shortness of breath and weight loss.  CT CHEST WITH CONTRAST  Technique:  Multidetector CT imaging of the chest was performed following the standard protocol during bolus administration of intravenous contrast.  Contrast: OMNIPAQUE IOHEXOL 300 MG/ML  SOLN  Comparison: None.  Findings: Sub-centimeter thyroid nodule(s) noted, too small to characterize, but most likely benign in the absence of known clinical risk factors for thyroid carcinoma.  Endotracheal tube is in satisfactory position.  No pathologically enlarged mediastinal, hilar or axillary lymph nodes.  Atherosclerotic calcification of the arterial vasculature including coronary arteries.  Pulmonary arteries are enlarged, as is the heart.  No pericardial effusion.  There  is a centrally obstructing mass in the right subcarinal region, measuring approximately 3.8 x 5.1 cm.  It obstructs the bronchus intermedius with resultant heterogeneous collapse of the right middle and right lower lobes.  Small right pleural effusion with slight hyperattenuation of the parietal pleura.  No definite pleural nodularity.  Lungs are severely emphysematous. Collapse/consolidation is mild in the left lower lobe.  Incidental imaging of the upper abdomen shows a faint blush of hyper attenuation in the peripheral right hepatic lobe, which may representing a perfusion anomaly.  Mild intrahepatic and extrahepatic biliary ductal prominence may relate to cholecystectomy.  A 4.6 cm low attenuation lesion in the right kidney is incompletely imaged.  Adrenal glands are grossly unremarkable.  Postoperative changes at the gastroesophageal junction.  No worrisome lytic or sclerotic lesions.  Degenerative changes  are seen in the spine.  Probable bone islands are seen in upper thoracic vertebral bodies.  IMPRESSION:  1.  Centrally obstructing mass in the superior segment right lower lobe with postobstructive collapse of the right middle and right lower lobes, and a small right pleural effusion. Findings are most consistent with primary bronchogenic carcinoma. 2.  Mild collapse/consolidation in the left lower lobe. 3.  Pulmonary arterial hypertension.   Original Report Authenticated By: Reyes Ivan, M.D.    Nm Bone Scan Whole Body  08/24/2012  *RADIOLOGY REPORT*  Clinical Data: Lung cancer, staging for bone metastases, left posterior side and low back pain  NUCLEAR MEDICINE WHOLE BODY BONE SCINTIGRAPHY  Technique:  Whole body anterior and posterior images were obtained approximately 3 hours after intravenous injection of radiopharmaceutical.  Radiopharmaceutical: CURIE TC-MDP TECHNETIUM TC 50M MEDRONATE IV KIT  Comparison: None Correlation:  CT chest 08/17/2012, CT abdomen and pelvis 08/23/2012  Findings: Photopenic regions at knees bilaterally likely reflect bilateral total knee arthroplasties. Mild bladder retention of tracer. No abnormal foci of osseous tracer accumulation identified to suggest osseous metastatic disease. Urinary tract and soft tissue distribution of tracer otherwise normal.  IMPRESSION: Prior bilateral total knee replacements. No scintigraphic evidence of osseous metastatic disease. Specifically, no findings are seen to account for patient's symptoms.   Original Report Authenticated By: Lollie Marrow, M.D.    Ct Abdomen Pelvis W Contrast  08/24/2012  *RADIOLOGY REPORT*  Clinical Data: Lung cancer staging.  CT ABDOMEN AND PELVIS WITH CONTRAST  Technique:  Multidetector CT imaging of the abdomen and pelvis was performed following the standard protocol during bolus administration of intravenous contrast.  Contrast: OMNIPAQUE IOHEXOL 300 MG/ML  SOLN  Comparison: 08/17/2012   Findings: Atelectatic right middle lobe and right lower lobe noted with moderate sized right pleural effusion mildly increased in size compared the prior.  Mild enhancement node along the right pleural surface favoring an exudative effusion.  There is mild atelectasis posteriorly in the left lower lobe.  Epicardial lymph node short axis 5 mm, image 12 of series 2.  Clips noted at the gastroesophageal junction.  There is some hypodensity in the atelectatic right lower lobe posteriorly, of uncertain significance.  Mild intrahepatic biliary dilatation noted.  Common bile duct 1.3 cm in diameter.  Gallbladder absent.  I do not observe a liver mass.  The spleen and pancreas appear normal.  Small mass-like appearance of medial limb of the left adrenal gland at ten millimeters, nonspecific for adenoma versus other lesion.  Right adrenal gland normal.  4.5 cm right kidney upper pole Bosniak category one cyst.  Left kidney unremarkable.  Aortoiliac atherosclerotic calcification noted.  Suspected intraluminal  lipoma in the transverse duodenum, 1.6 cm on image 54 of series 2.  Trace ascites noted in the right pericolic gutter and along the inferior edge of the right liver.  Right external iliac node short axis 0.9 cm, image 76 of series 2.  Sigmoid diverticulosis noted without diverticulitis.  Urinary bladder unremarkable.  Uterus absent.  No pathologic adenopathy in the abdomen noted.  Postoperative findings noted in the lower lumbar spine.  Degenerative loss articular space noted in both hips.  Lower thoracic spondylosis noted.  Multilevel lumbar facet arthropathy is noted, with intervertebral spurring causing right foraminal impingement L5-S1.  IMPRESSION:  1.  Atelectatic right lower and middle lobes, with right pleural effusion with enhancing margins favoring exudative pleural effusion.  Malignant pleural effusion not excluded.  Triangular hypoperfusion in the right lower lobe is nonspecific and can sometimes be seen in  the setting of collapse causing vascular constriction, although the subsegmental pulmonary embolus in the right lower lobe is not totally excluded. 2.  Small mass of the medial limb of the left adrenal gland, technically nonspecific for malignancy versus adenoma. This could be further characterized with MRI or PET CT. 3. Intrahepatic and extrahepatic biliary dilatation.  At least a portion of this may be secondary to prior cholecystectomy causing physiologic response. 4.  Atherosclerosis. 5.  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 6.  Trace ascites. 7.  Sigmoid diverticulosis. 8.  Lumbar spondylosis with prior postoperative findings in the lower lumbar spine.  Right foraminal impingement at L5-S1 due to spurring.   Original Report Authenticated By: Dellia Cloud, M.D.    Dg Chest Port 1 View  08/20/2012  *RADIOLOGY REPORT*  Clinical Data: Congestive heart failure.  Extubation.  Lung cancer.  PORTABLE CHEST - 1 VIEW  Comparison: 08/20/2012 and 08/18/2012  Findings: Central venous catheter tip is at the cavoatrial junction.  Endotracheal tube and NG tube have been removed.  There is minimal atelectasis at the left lung base.  Complete collapse of the right lower lobe persists with a small right effusion.  IMPRESSION: Minimal atelectasis at the left base after extubation.  Persistent complete collapse of the right lower lobe with small effusion.   Original Report Authenticated By: Gwynn Burly, M.D.    Dg Chest Port 1 View  08/19/2012  *RADIOLOGY REPORT*  Clinical Data: Volume loss and check endotracheal tube placement.  PORTABLE CHEST - 1 VIEW  Comparison: 08/18/2012  Findings: Endotracheal tube is 3.1 cm above the carina.  There is persistent collapse of the right lower lobe.  Hazy interstitial densities throughout both lungs are unchanged.  Heart size is grossly stable.  Central line tip in the lower SVC region and stable. Slightly improved aeration at the left lung base.   IMPRESSION: Slightly improved aeration at the left lung.  Persistent volume loss in the right lung consistent with right lower lobe collapse with right pleural fluid.   Original Report Authenticated By: Richarda Overlie, M.D.    Dg Chest Port 1 View  08/18/2012  *RADIOLOGY REPORT*  Clinical Data: Endotracheal tube placement, pleural effusions  PORTABLE CHEST - 1 VIEW  Comparison: 08/16/2012; chest CT - 08/17/2012  Findings:  Grossly unchanged cardiac silhouette and mediastinal contours with obscuration of the right heart border secondary to the right lower and mid lung consolidation.  There is persistent right-sided volume loss with deviation of the cardiomediastinal structures into the right hemithorax.  Stable position of support apparatus.  No pneumothorax.  Unchanged small right-sided pleural effusion.  Left basilar opacities favored to represent atelectasis.  Unchanged bones.  IMPRESSION: 1.  Stable positioning of support apparatus.  No pneumothorax. 2.  Grossly unchanged right lower and mid lung consolidation and volume loss.   Original Report Authenticated By: Waynard Reeds, M.D.    Dg Chest Port 1 View  08/16/2012  *RADIOLOGY REPORT*  Clinical Data: Status post bronchoscopy.  PORTABLE CHEST - 1 VIEW  Comparison: None.  Findings: 1622 hours.  Endotracheal tube tip is 4.4 cm above the base of the carina.  Right IJ central line tip projects at the distal SVC level. The cardiopericardial silhouette is enlarged. Asymmetric elevation of the right hemidiaphragm noted with probable associated component of right middle lobe collapse / consolidation. There is a small right pleural effusion. Interstitial markings are diffusely coarsened with chronic features. Telemetry leads overlie the chest.  IMPRESSION: Support apparatus as described.  Cardiomegaly with probable asymmetric elevation of the right hemidiaphragm and some associated right middle lobe collapse / consolidation.  Small right pleural effusion noted.    Original Report Authenticated By: ERIC A. MANSELL, M.D.     ASSESSMENT: 71 year old female with non-small cell lung carcinoma presenting initially with hemoptysis. Requiring ventilatory support. She also received emergent radiation therapy. She was successfully extubated and discharged to home per her wishes. She is now seen in medical oncology for discussion of treatment options. I have recommended chemotherapy consisting of carboplatinum and Taxol given every 21 days with day 2 Neulasta. We discussed risks and benefits of chemotherapy. We discussed her pathology diagnosis rationale for treatment. I do think she needs a PET scan. However I am uncertain after 45-60 minutes of discussion whether or not patient will opt for anything. We did discuss hospice as well. Patient's family at this time feels that she should be treated but the patient is uncertain of whether she wants to be treated or not. We discussed side effects of chemotherapy she had multitudes of questions. I do wonder about her understanding of exactly what she has. Even though I did try very hard to explain it to her I do think she needs to process a lot of things hopefully her family can help her. Patient does live in [place] and she was wondering if [facility] could be [facility]. I do think that is a good idea and I have made a referral.   PLAN:   #1 patient will be referred to [facility] for consultation and to see if she would do any kind of therapy there.  #2 I will also tentatively set her up to see me in December.   All questions were answered. The patient knows to call the clinic with any problems, questions or concerns. We can certainly see the patient much sooner if necessary.  I spent 60 minutes counseling the patient face to face. The total time spent in the appointment was 60 minutes.    Drue Second, MD Medical/Oncology [facility] [phone] (beeper) [phone]  (Office)  09/09/2012, 5:01 PM

## 2012-09-09 NOTE — Telephone Encounter (Signed)
Gave patient appointment for 10-14-2012 starting at 10:30am I will call [facility] on Monday (09-12-2012) to set up the patient for an second opinion

## 2012-09-09 NOTE — Patient Instructions (Addendum)
We discussed your diagnosis of lung cancer specifically non small cell lung cancer  Lung Cancer Lung cancer is a tumor which starts as a growth in your lungs. Cancer is a group of many related diseases that begin in cells, the building blocks of the body. Normally, cells grow and divide to produce more cells only when the body needs them. Sometimes cells keep dividing when new cells are not needed. These extra cells may form a mass of tissue called a growth or tumor. Tumors can be either benign (not cancerous) or malignant (cancerous). Cancer can begin in any organ or tissue of the body. The original tumor (where the tumor started out) is called the primary cancer and is usually named for where it begins.  Lung cancer is the most common cause of cancer death in men and women. There are several different types of lung cancers. Usually, lung cancer is described as either small-cell lung cancer or non-small-cell lung cancer. Other types of cancer occur in the lungs, including carcinoid and cancers spread from other organs. The types of cancer have different behavior and treatment. CAUSES  This cancer usually starts when the lungs are exposed to harmful chemicals. When you quit smoking, your risk of lung cancer falls each year (but is never the same as a person who has never smoked).  Other risks include:   Radon gas exposure.  Asbestos and other industrial substance exposure.  Second hand tobacco smoke.  Air pollution.  Family or personal history of lung cancer.  Age over 22. SYMPTOMS  Lung cancer can cause many symptoms. They depend on the type of cancer, its location and other factors. Symptoms of lung cancer can include:  Cough (either new, different or more severe).  Shortness of breath.  Coughing up blood (hemoptysis).  Chest pain.  Hoarseness.  Swelling of the face.  Drooping eyelid.  Changes in blood tests: low sodium (hyponatremia), high calcium (hypercalcemia) or low blood  count (anemia).  Weight loss. In its early stages, lung cancer may not have symptoms and can be discovered by accident. Many of the symptoms above can be caused by diseases other than lung cancer. DIAGNOSIS  In early lung cancer, the patient often does not notice problems. It usually has spread by the time problems are first noticed. Your caregiver may suspect lung cancer based on your symptoms, your exam or based on tests (such as x-rays) obtained for other reasons. Common tests that help your caregiver diagnose your condition include:  Chest x-ray.  CT scan of the lungs and chest.  Blood tests. If a tumor is found, a biopsy will be necessary to confirm that cancer is present and to determine the type of cancer. TREATMENT   Surgery offers a hope for a cure if the cancer has not spread and the cancer is not a small cell (oat cell) cancer of the lung. Surgery cannot cure the small cell type of cancer.  Radiation Therapy is a form of high energy X-ray that helps slow or kill the cancer. It is often used along with medications (chemotherapy) to help treat the cancer and control pain.  Chemotherapy is used in combination with surgery in advanced cancer. It is also used in all small cell cancers.  Many new treatments look promising.  Your caregiver can give you more information and discuss treatment options that are best for your type of cancer. HOME CARE INSTRUCTIONS   If you smoke, stop!  Take all medications as told.  Keep all  appointments with your caregiver and other specialists.  Ask your caregiver if you should see a cancer specialist, if that has not been arranged.  If you require oxygen or breathing equipment, be sure you know how to use it and who to call with questions.  Follow any special diet directions. If you have problems with appetite, ask your caregiver for help. SEEK MEDICAL CARE IF:   You have had a surgical procedure are you are having trouble recovering.  You  have ongoing weight loss.  You have decreased strength or energy past the point when your caregiver said you would feel better.  You develop nausea or lightheadedness.  You have pain that is not improving. SEEK IMMEDIATE MEDICAL CARE IF:   You cough up clotted blood or bright red blood.  Your pain is uncontrolled.  You develop new difficulty breathing or chest pain.  You develop swelling in one or both ankles or legs, or swelling in your face or neck.  You develop new headache or confusion. Document Released: 01/25/2001 Document Revised: 01/11/2012 Document Reviewed: 11/05/2008 [facility] Patient Information 2013 [place], [place].   Implanted Port Instructions An implanted port is a central line that has a round shape and is placed under the skin. It is used for long-term IV (intravenous) access for:  Medicine.  Fluids.  Liquid nutrition, such as TPN (total parenteral nutrition).  Blood samples. Ports can be placed:  In the chest area just below the collarbone (this is the most common place.)  In the arms.  In the belly (abdomen) area.  In the legs. PARTS OF THE PORT A port has 2 main parts:  The reservoir. The reservoir is round, disc-shaped, and will be a small, raised area under your skin.  The reservoir is the part where a needle is inserted (accessed) to either give medicines or to draw blood.  The catheter. The catheter is a long, slender tube that extends from the reservoir. The catheter is placed into a large vein.  Medicine that is inserted into the reservoir goes into the catheter and then into the vein. INSERTION OF THE PORT  The port is surgically placed in either an operating room or in a procedural area (interventional radiology).  Medicine may be given to help you relax during the procedure.  The skin where the port will be inserted is numbed (local anesthetic).  1 or 2 small cuts (incisions) will be made in the skin to insert the port.  The  port can be used after it has been inserted. INCISION SITE CARE  The incision site may have small adhesive strips on it. This helps keep the incision site closed. Sometimes, no adhesive strips are placed. Instead of adhesive strips, a special kind of surgical glue is used to keep the incision closed.  If adhesive strips were placed on the incision sites, do not take them off. They will fall off on their own.  The incision site may be sore for 1 to 2 days. Pain medicine can help.  Do not get the incision site wet. Bathe or shower as directed by your caregiver.  The incision site should heal in 5 to 7 days. A small scar may form after the incision has healed. ACCESSING THE PORT Special steps must be taken to access the port:  Before the port is accessed, a numbing cream can be placed on the skin. This helps numb the skin over the port site.  A sterile technique is used to access the port.  The port is accessed with a needle. Only "non-coring" port needles should be used to access the port. Once the port is accessed, a blood return should be checked. This helps ensure the port is in the vein and is not clogged (clotted).  If your caregiver believes your port should remain accessed, a clear (transparent) bandage will be placed over the needle site. The bandage and needle will need to be changed every week or as directed by your caregiver.  Keep the bandage covering the needle clean and dry. Do not get it wet. Follow your caregiver's instructions on how to take a shower or bath when the port is accessed.  If your port does not need to stay accessed, no bandage is needed over the port. FLUSHING THE PORT Flushing the port keeps it from getting clogged. How often the port is flushed depends on:  If a constant infusion is running. If a constant infusion is running, the port may not need to be flushed.  If intermittent medicines are given.  If the port is not being used. For intermittent  medicines:  The port will need to be flushed:  After medicines have been given.  After blood has been drawn.  As part of routine maintenance.  A port is normally flushed with:  Normal saline.  Heparin.  Follow your caregiver's advice on how often, how much, and the type of flush to use on your port. IMPORTANT PORT INFORMATION  Tell your caregiver if you are allergic to heparin.  After your port is placed, you will get a manufacturer's information card. The card has information about your port. Keep this card with you at all times.  There are many types of ports available. Know what kind of port you have.  In case of an emergency, it may be helpful to wear a medical alert bracelet. This can help alert health care workers that you have a port.  The port can stay in for as long as your caregiver believes it is necessary.  When it is time for the port to come out, surgery will be done to remove it. The surgery will be similar to how the port was put in.  If you are in the hospital or clinic:  Your port will be taken care of and flushed by a nurse.  If you are at home:  A home health care nurse may give medicines and take care of the port.  You or a family member can get special training and directions for giving medicine and taking care of the port at home. SEEK IMMEDIATE MEDICAL CARE IF:   Your port does not flush or you are unable to get a blood return.  New drainage or pus is coming from the incision.  A bad smell is coming from the incision site.  You develop swelling or increased redness at the incision site.  You develop increased swelling or pain at the port site.  You develop swelling or pain in the surrounding skin near the port.  You have an oral temperature above 102 F (38.9 C), not controlled by medicine. MAKE SURE YOU:   Understand these instructions.  Will watch your condition.  Will get help right away if you are not doing well or get  worse. Document Released: 10/19/2005 Document Revised: 01/11/2012 Document Reviewed: 01/10/2009 [facility] Patient Information 2013 [place], [place].

## 2012-09-22 ENCOUNTER — Ambulatory Visit: Payer: Medicare Other | Admitting: Radiation Oncology

## 2012-10-14 ENCOUNTER — Ambulatory Visit: Payer: Medicare Other | Admitting: Oncology

## 2012-10-14 ENCOUNTER — Other Ambulatory Visit: Payer: Medicare Other | Admitting: Lab

## 2013-01-06 IMAGING — CT CT ABD-PELV W/ CM
2 of 5 series · 16 of 46 positions shown, 18 images · IV contrast (OMNIPAQUE)
Comparison: 08/17/2012

CLINICAL DATA: Lung cancer staging.

CT ABDOMEN AND PELVIS WITH CONTRAST
TECHNIQUE: Multidetector CT imaging of the abdomen and pelvis was
performed following the standard protocol during bolus
administration of intravenous contrast.
Contrast: 100mL OMNIPAQUE IOHEXOL 300 MG/ML  SOLN

[Series 2: rtn a/p with · axial · 0.83mm/px · z∈[+862,+1266]mm · 13 of 93 slices shown, 15 images]
[im 6/93  soft-tissue]
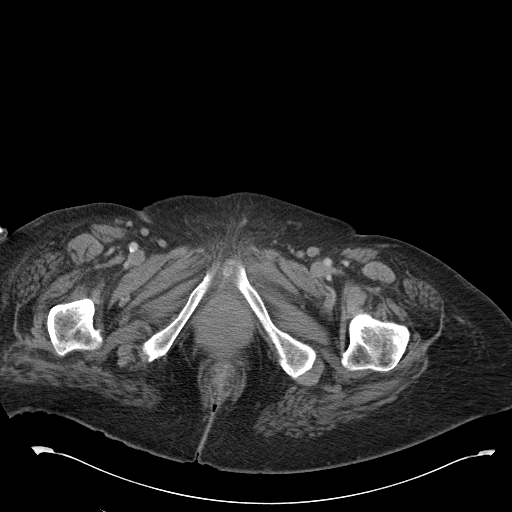
[im 6/93  bone]
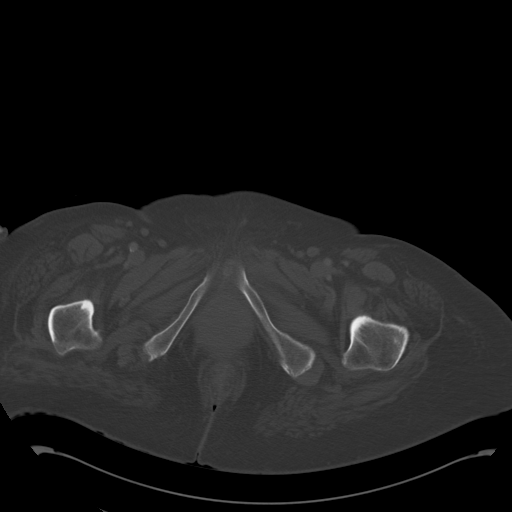
[im 11/93  soft-tissue]
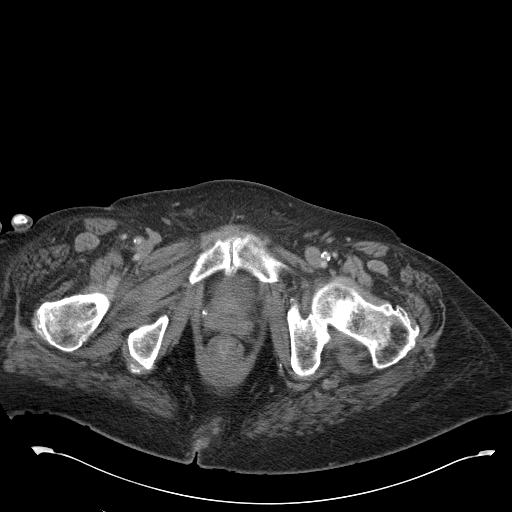
[im 21/93  soft-tissue]
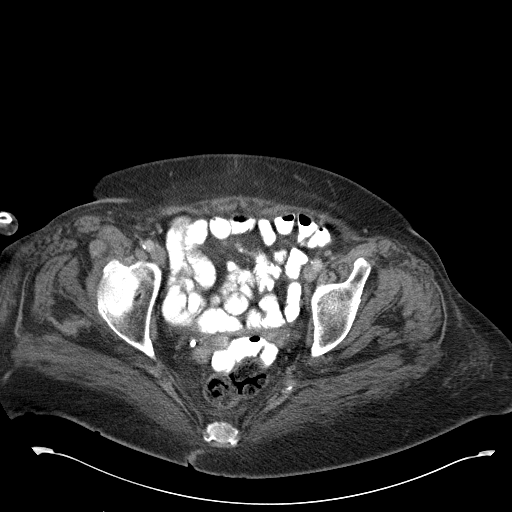
[im 26/93  soft-tissue]
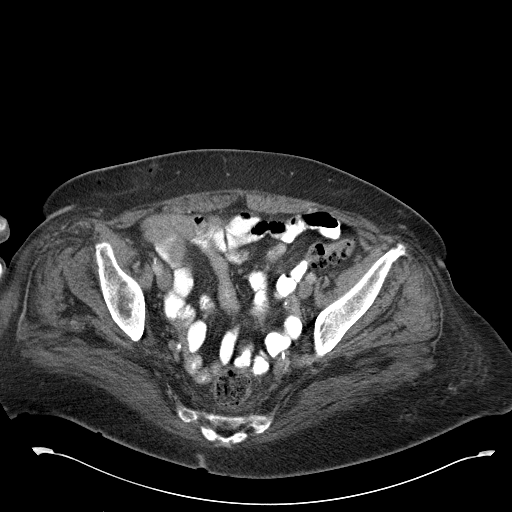
[im 31/93  soft-tissue]
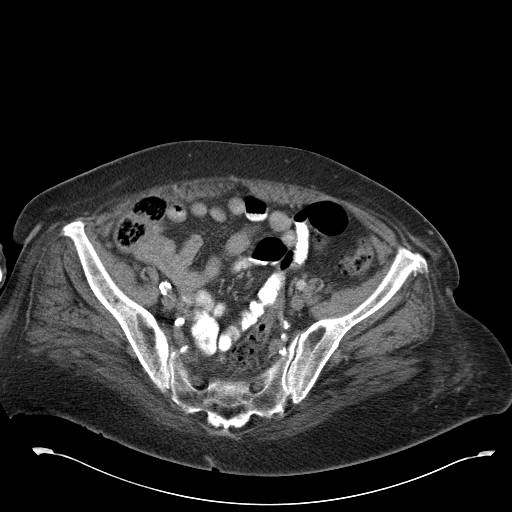
[im 41/93  soft-tissue]
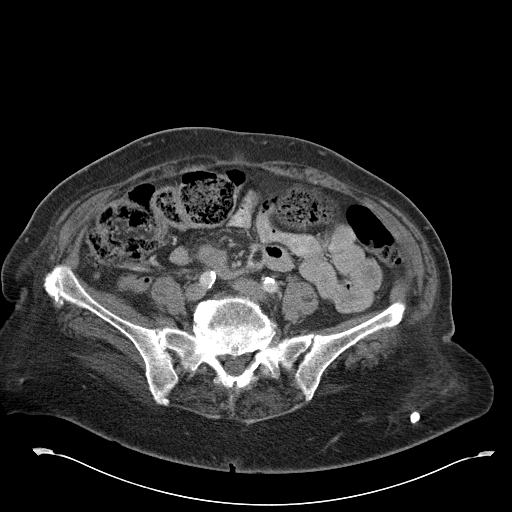
[im 47/93  soft-tissue]
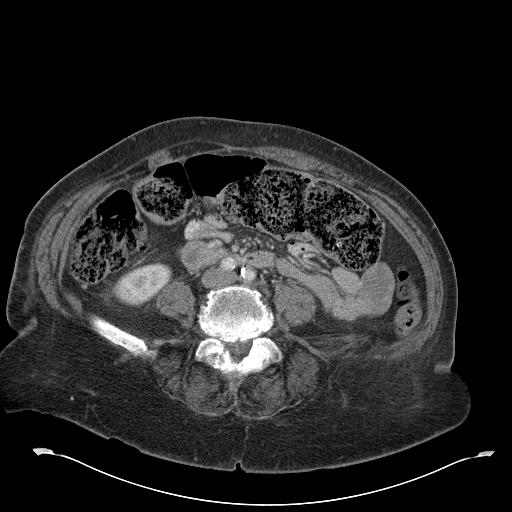
[im 52/93  soft-tissue]
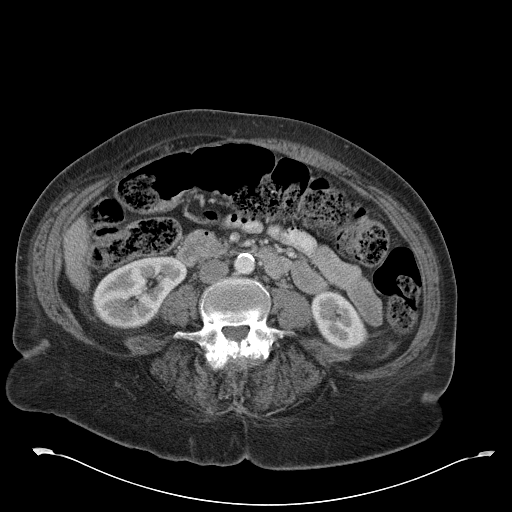
[im 62/93  soft-tissue]
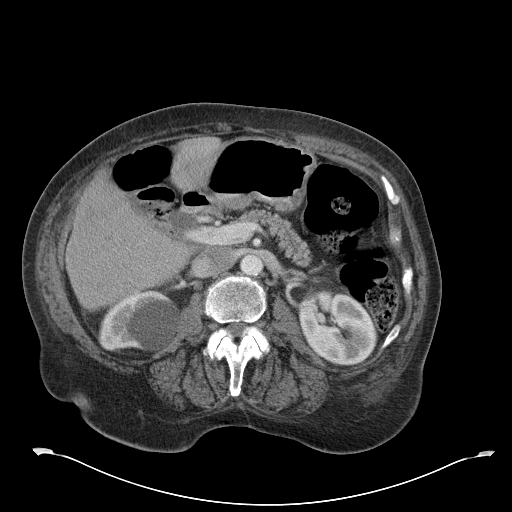
[im 62/93  bone]
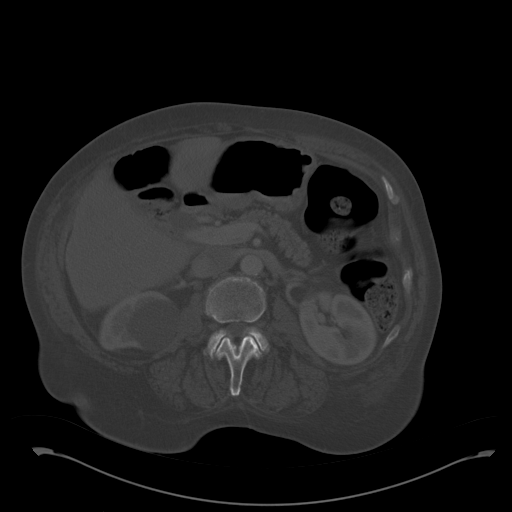
[im 67/93  soft-tissue]
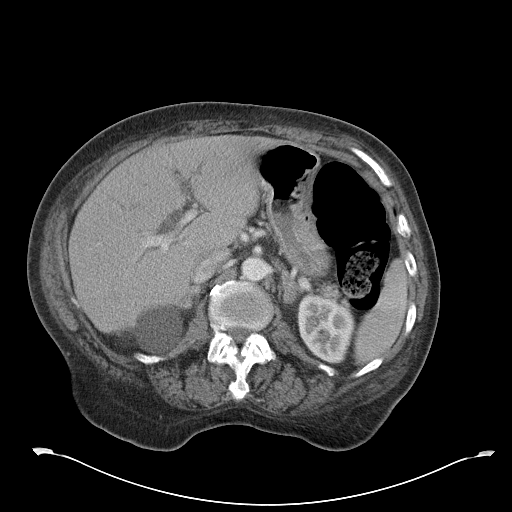
[im 72/93  soft-tissue]
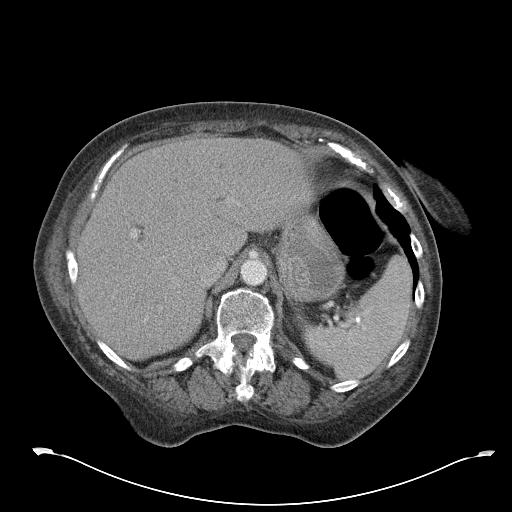
[im 82/93  soft-tissue]
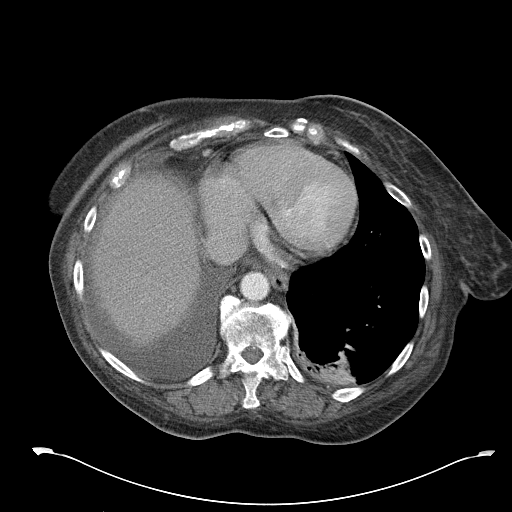
[im 87/93  soft-tissue]
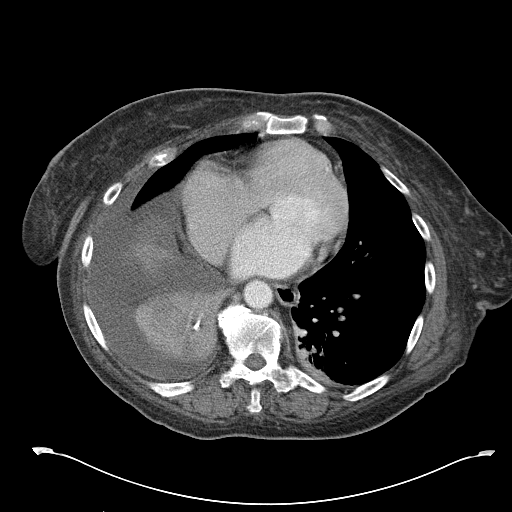

[Series 602: cor · coronal · 0.91mm/px · 3 of 128 slices shown]
[im 43/128  soft-tissue]
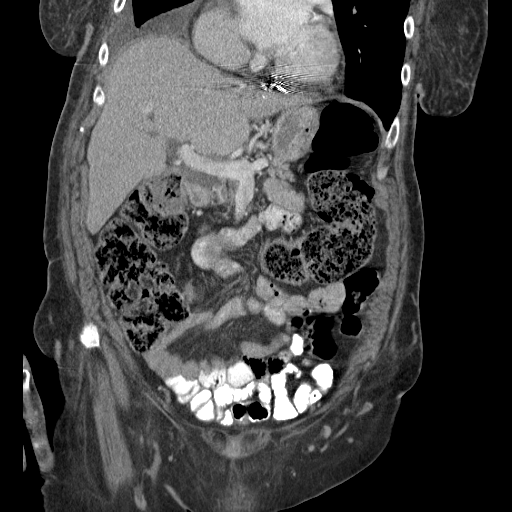
[im 57/128  soft-tissue]
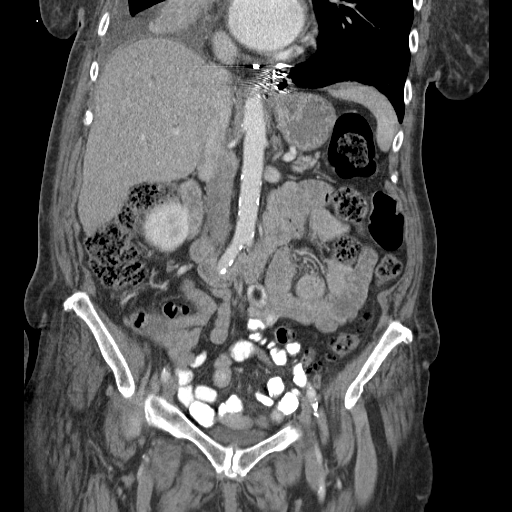
[im 71/128  soft-tissue]
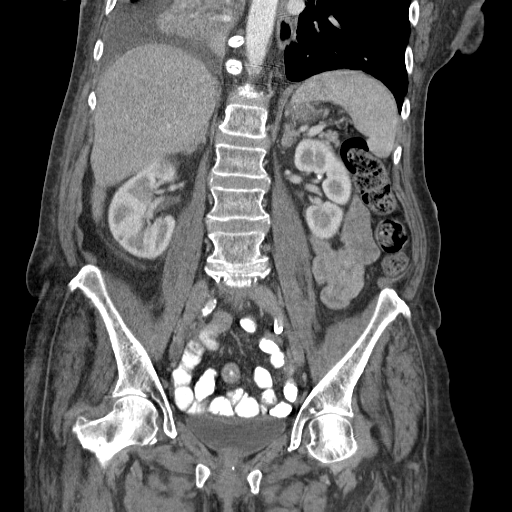

[16 of 46 positions shown; findings below may reference images not displayed]

FINDINGS: Atelectatic right middle lobe and right lower lobe noted
with moderate sized right pleural effusion mildly increased in size
compared the prior.  Mild enhancement node along the right pleural
surface favoring an exudative effusion.  There is mild atelectasis
posteriorly in the left lower lobe.  Epicardial lymph node short
axis 5 mm, image 12 of series 2.

Clips noted at the gastroesophageal junction.  There is some
hypodensity in the atelectatic right lower lobe posteriorly, of
uncertain significance.  Mild intrahepatic biliary dilatation
noted.  Common bile duct 1.3 cm in diameter.  Gallbladder absent.

I do not observe a liver mass.  The spleen and pancreas appear
normal.  Small mass-like appearance of medial limb of the left
adrenal gland at ten millimeters, nonspecific for adenoma versus
other lesion.  Right adrenal gland normal.

4.5 cm right kidney upper pole Bosniak category one cyst.  Left
kidney unremarkable.

Aortoiliac atherosclerotic calcification noted.  Suspected
intraluminal lipoma in the transverse duodenum, 1.6 cm on image 54
of series 2.

Trace ascites noted in the right pericolic gutter and along the
inferior edge of the right liver.

Right external iliac node short axis 0.9 cm, image 76 of series 2.

Sigmoid diverticulosis noted without diverticulitis.  Urinary
bladder unremarkable.  Uterus absent.

No pathologic adenopathy in the abdomen noted.  Postoperative
findings noted in the lower lumbar spine.  Degenerative loss
articular space noted in both hips.

Lower thoracic spondylosis noted.  Multilevel lumbar facet
arthropathy is noted, with intervertebral spurring causing right
foraminal impingement L5-S1.
IMPRESSION: 1.  Atelectatic right lower and middle lobes, with right pleural
effusion with enhancing margins favoring exudative pleural
effusion.  Malignant pleural effusion not excluded.  Triangular
hypoperfusion in the right lower lobe is nonspecific and can
sometimes be seen in the setting of collapse causing vascular
constriction, although the subsegmental pulmonary embolus in the
right lower lobe is not totally excluded.
2.  Small mass of the medial limb of the left adrenal gland,
technically nonspecific for malignancy versus adenoma. This could
be further characterized with MRI or PET CT.
3. Intrahepatic and extrahepatic biliary dilatation.  At least a
portion of this may be secondary to prior cholecystectomy causing
physiologic response.
4.  Atherosclerosis.
5.  Suspected intraluminal lipoma in the transverse duodenum.
6.  Trace ascites.
7.  Sigmoid diverticulosis.
8.  Lumbar spondylosis with prior postoperative findings in the
lower lumbar spine.  Right foraminal impingement at L5-S1 due to
spurring.

## 2013-05-02 DEATH — deceased

## 2015-02-19 NOTE — Discharge Summary (Signed)
PATIENT NAME:  Meredith Patton, Meredith Patton MR#:  [id number] DATE OF BIRTH:  [birth year]  DATE OF ADMISSION:  08/11/2012 DATE OF DISCHARGE:  08/16/2012  TYPE OF DISCHARGE: Patient transferred to tertiary medical facility.   REASON FOR ADMISSION: Respiratory failure with hemoptysis requiring mechanical ventilation.   HISTORY OF PRESENT ILLNESS: The patient is a 74 year old female with a significant history of pulmonary hypertension and chronic respiratory failure on oxygen. The patient presented to the Emergency Room with hemoptysis. She was also having nosebleeds and hematemesis. In the Emergency Room, the patient was intubated due to her severe hemoptysis and was subsequently admitted for further evaluation.   PAST MEDICAL HISTORY:  1. Chronic atrial fibrillation on anticoagulation.  2. Pulmonary hypertension.  3. Chronic respiratory failure, on oxygen.  4. Chronic diastolic congestive heart failure.  5. Benign hypertension.  6. Hyperlipidemia.  7. Anxiety.  8. History of gastric ulcer.  9. Previous stroke with left-sided weakness.  10. Status post hysterectomy.  11. Status post cholecystectomy.  12. Status post bilateral knee surgeries.   MEDICATIONS ON ADMISSION: Please see admission note.   ALLERGIES: Gabapentin and morphine.   SOCIAL HISTORY: The patient does have a history of tobacco abuse. No history of alcohol abuse.   FAMILY HISTORY: Positive for coronary artery disease.   REVIEW OF SYSTEMS: Unable to obtain.   PHYSICAL EXAMINATION: The patient was sedated on the vent in no acute distress. Vital signs were stable and she was afebrile. HEENT exam was unremarkable. Neck was supple without JVD. Lungs revealed decreased breath sounds. Coarse rhonchi were noted. There was a slight expiratory wheeze. Cardiac examination revealed an irregularly irregular rhythm. No significant rubs or gallops. Abdomen was soft, nontender with normoactive bowel sounds. Extremities were without edema. Neurologic  exam was grossly nonfocal.   HOSPITAL COURSE: The patient was admitted with acute respiratory failure due to massive hemoptysis and admitted to the Intensive Care Unit. She was maintained on the vent and followed by Dr. Mortimer Fries and Dr. Raul Del of pulmonology. Bronchoscopy was attempted. There appeared to be a large mass in the right lower lobe. However, there was so much bleeding that bronchoscopy was essentially nondiagnostic. The patient was given FFP to reverse her Coumadin. She continued to have hemoptysis. She remained vent dependent with no improvement in her respiratory status. We were unable to wean the ventilator. Her heart rate and blood pressure remained stable. However, because of her persistent hemoptysis, it was recommended that she undergo embolization of the bronchial tree. As a result, the patient is now transferred to [facility] for further treatment and evaluation.   DISCHARGE DIAGNOSES:  1. 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  2. Massive hemoptysis.  3. Acute on chronic respiratory failure requiring mechanical ventilation.  4. Chronic atrial fibrillation.  5. Pulmonary hypertension.  6. Chronic systolic congestive heart failure.  7. Previous left body stroke.  8. History of gastric ulcer.  9. Hyperlipidemia.  10. Benign hypertension.  11. Status post cholecystectomy.  12. Status post hysterectomy.  ____________________________ Leonie Douglas Doy Hutching, MD jds:cms D: 08/16/2012 09:55:50 ET T: 08/16/2012 10:05:29 ET  JOB#: [id number] Skyleigh Windle D Maximo Spratling MD ELECTRONICALLY SIGNED 08/16/2012 10:31

## 2015-02-19 NOTE — Consult Note (Signed)
General Aspect Pt is a 74 yo female with history of severe copd, severe pulmonary hypertension with RVSP of 80-100, history of atrial fibrillation treated with anticoagulation, history of insignificant cad admitted iwth increased shortness of breath and hemoptosis. She was noted to have a elevated inr secondary to warfarin therapy. She was intubated due to hypoxia and continued hemoptosis. She had her anticoagulation reversed with FFp. She has a mild serum tropoinin elevation at 0.36. EKG reveals afib with variabale ventriicular response She is intubatd and unable to give a history   Physical Exam:   GEN critically ill appearing    NECK No masses    RESP vent dependent    CARD Irregular rate and rhythm  Tachycardic    EXTR negative cyanosis/clubbing, positive edema    SKIN normal to palpation    NEURO intubated and sedated    PSYCH sedated   Review of Systems:   ROS Pt not able to provide ROS    Medications/Allergies Reviewed Medications/Allergies reviewed     Intubated: patient intubated with 7.5 ETT, secured at 24 cm at lip. BBS equal. Positive color change via ETCO2 detection. No complications noted at this time. will continue to monitor   Pulmonary hypertension:    Atrial Fibrillation:    hysterectomy:    venous stasis:    anxiety:    chronic bronchitis:    cva:    hyperlipidemia:    Gastric Ulcers:    nerves:    HTN:    AAA - Abdominal Aortic Aneurysm Repair:    hiatal hernia:    Lumbar bone spur removed:    Gall Bladder Removal:   Home Medications: Medication Instructions Status  Letairis 5 mg oral tablet 1 tab(s) orally once a day Active  Imdur 60 mg oral tablet, extended release 1 tab(s) orally once a day Active  Spiriva 18 mcg inhalation capsule 1 each inhaled once a day Active  Toprol-XL 100 mg oral tablet, extended release 1 tab(s) orally 2 times a day Active  Xanax 0.5 mg oral tablet 1 tab(s) orally every 8 hours, As Needed- for Anxiety,  Nervousness  Active  Ventolin HFA 90 mcg/inh inhalation aerosol 2 puff(s) inhaled 4 times a day Active  Coumadin 7.5 mg oral tablet 2 tab(s) orally once a day (in the evening) Active  Lanoxin 125 mcg (0.125 mg) oral tablet 1 tab(s) orally once a day Active  Lasix 40 mg oral tablet 1 tab(s) orally 2 times a day Active  Percocet 5/325 oral tablet 1 tab(s) orally every 6 hours, As Needed- for Pain  Active  Lumigan 0.03% ophthalmic solution 1 drop(s) to each eye once a day (at bedtime) as directed Active  Protonix 40 mg oral delayed release tablet 1 tab(s) orally 2 times a day Active  Altace 5 mg oral capsule 1 cap(s) orally 2 times a day Active  Zofran 8 mg oral tablet 1 tab(s) orally every 6 hours, As Needed for nausea Active  Advair Diskus 250 mcg-50 mcg inhalation powder 1 puff(s) inhaled 2 times a day Active  Ultram 50 mg oral tablet 1 tab(s) orally every 6 hours, As Needed- for Pain  Active  Slow Fe (as elemental iron) 45 mg oral tablet, extended release 1 tab(s) orally once a day Active  Klor-Con M20 oral tablet, extended release 1 tab(s) orally 2 times a day Active  Colace 100 mg oral capsule 1 cap(s) orally 2 times a day Active   EKG:   Abnormal NSSTTW changes  Interpretation atrial fibrilation with variable ventricular response    Morphine: Swelling  Gabapentin: Hives    Impression 74 yo female with endstage copd, severe pulmonary hypertension on Letaris, atrial fibrillatioin treated with rate control and anticoagulation admitted with respiratory failure and hemoptosis. She is currently intubated and had her anticagulation reversed with FFP. She is hemodynamically stable. Etiology of hemoptosis is likely multi factoral to include anticagulation, pulmonary hypertension and possible malignancy. She has a mildly elevated serum tropoinin which is likely secondary to increased demand. Not a candidate for invasive or non invasive ischemic work up at present. Agree with discontinuing  anticoagulation therapy now and after dischrage due to bleeding.    Plan 1. Continue with current meds for heart rate control 2. No anticoagulation due to hemoptosis. 3. Attempt to wean vent. 4. Continue Letaris as tolerated for pullmonary hypertensioin 5. Very poor prognosis. 6. Echo to evalaute lv function and pulmonary pressures   Electronic Signatures: Teodoro Spray (MD)  (Signed 11-Oct-13 14:40)  Authored: General Aspect/Present Illness, History and Physical Exam, Review of System, Past Medical History, Home Medications, EKG , Allergies, Impression/Plan   Last Updated: 11-Oct-13 14:40 by Teodoro Spray (MD)

## 2015-02-19 NOTE — H&P (Signed)
PATIENT NAME:  Meredith Patton, Meredith Patton MR#:  [id number] DATE OF BIRTH:  November 18, 1940  DATE OF ADMISSION:  08/11/2012  PRIMARY CARE PHYSICIAN: Fulton Reek, MD    PULMONOLOGIST: Wallene Huh, MD    CHIEF COMPLAINT: Coughing up blood.   HISTORY OF PRESENT ILLNESS: The patient is a 74 year old female with multiple medical problems, including atrial fibrillation on Coumadin, hypertension, congestive heart failure, history of cerebrovascular accident, severe pulmonary hypertension. She was recently held on her Coumadin for a couple of days and just started on a new medication called Adcirca which was started today by Dr. Raul Del. She started by coughing up blood, and then it was so much it came out of her new nose, and she started vomiting up the blood. In the Emergency Room, she was intubated secondary to severe hemoptysis, and Hospitalist Services were contacted for further evaluation. History is obtained from the family at the bedside, Dr. Raul Del, and prior chart.   PAST MEDICAL HISTORY:  1. Atrial fibrillation, on Coumadin. 2. Hypertension. 3. Congestive heart failure.  4. Cerebrovascular accident with left-sided weakness.  5. Hyperlipidemia.  6. Anxiety. 7. Gastric ulcer.  8. Severe pulmonary arterial hypertension.   PAST SURGICAL HISTORY:  1. Cholecystectomy.  2. Hysterectomy.  3. Hiatal hernia.  4. Lumbar spur removal.  5. Bilateral total knee replacements.   MEDICATIONS:  1. Just started on Adcirca 20 mg daily.  2. Advair Diskus 250/50, 1 inhalation daily.  3. Altace 5 mg b.i.d.  4. Colace 100 mg b.i.d.   5. Coumadin 15 mg in the evening. 6. Imdur 60 mg daily.  7. Potassium 20 mEq b.i.d. 8. Lanoxin 125 mcg daily.  9. Lasix 40 mg b.i.d.   10. Lateris 5 mg daily.  11. Lumigan 0.3% ophthalmic solution, 1 drop each eye at bedtime.  12. Percocet 5/325, 1 tablet every 6 hours as needed for pain.  13. Protonix 40 mg b.i.d.  14. Iron daily.  15. Spiriva 1 inhalation  daily. 16. Metoprolol 100 mg b.i.d.   17. Ventolin 2 puffs q.i.d.  18. Xanax 0.5 mg every 8 hours.   SOCIAL HISTORY: The patient lives at home alone. The patient is a smoker, about a pack per day. No alcohol. No drug use. 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   FAMILY HISTORY: Father died at 12 of heart disease. Mother died at 17 of heart disease.   REVIEW OF SYSTEMS: Unable to obtain secondary to being intubated.   PHYSICAL EXAMINATION:  VITAL SIGNS: On presentation to the Emergency Room, temperature is 98.6, pulse 79, respirations 26, blood pressure 191/89, pulse oximetry 81% on room air.   GENERAL: Now no respiratory distress, breathing on the ventilator and sedated.   HEENT: Eyes: Pupils are equal, round, and reactive to light. Unable to test extraocular muscles. Ears, nose, mouth, and throat: Tympanic membranes no erythema. Nasal mucosa: Dried blood and no active bleeding. Throat no erythema.  Lips and gums no lesions.   NECK: No JVD. No bruits. No lymphadenopathy. No thyromegaly. No thyroid nodules palpated.  RESPIRATORY: Decreased breath sounds bilaterally. Coarse breath sounds, slight expiratory wheeze.   CARDIOVASCULAR: S1, S2 normal. No gallops, rubs, or murmurs heard. Carotid upstroke 2+ bilaterally. No bruits.   EXTREMITIES: Dorsalis pedis pulses are 2+ bilaterally.   ABDOMEN: Soft, nontender. No organomegaly/splenomegaly. Normoactive bowel sounds. No masses felt.   LYMPHATIC: No lymph nodes in the neck.   MUSCULOSKELETAL: No clubbing, edema, or cyanosis.   SKIN: No ulcers or lesions seen.   NEUROLOGICAL:  The patient is sedated, unable to test cranial nerves at this time.   PSYCHIATRIC: Unable to test secondary to being on sedation.   LABORATORY, DIAGNOSTIC AND RADIOLOGICAL DATA: PT 17.3, INR 1.4, PTT 34.3. Glucose 93, BUN 13, creatinine 0.74, sodium 141, potassium 4.0, chloride 106, CO2 27, calcium 8.5. White blood cell count 3.6, hemoglobin and hematocrit 12.8 and 39.4,  platelet count 197. Chest x-ray showed abnormal appearance of the right lung base, chronic atelectasis, loculated effusion not completely excluded. An ABG showed a pH of 7.34, pCO2 52, pO2 73--that is on assist control, oxygen saturation 93.5.   ASSESSMENT AND PLAN:  1. Acute respiratory failure secondary to massive hemoptysis, hypoxia in the Emergency Room: We will continue ventilator support and adjust ventilator based on blood gases. Pulmonary consultation done by Dr. Raul Del.  2. Massive hemoptysis and severe pulmonary hypertension: We will get a CT scan of the chest for further evaluation to rule out PE or other causes. Since the patient's INR is only 1.4, I doubt that the Coumadin would be doing this alone; but ER physician ordered [facility]FP to reverse INR. Dr. Raul Del will most likely do a bronchoscopy tomorrow. I will give empiric high-dose Solu-Medrol 125 mg IV once and every 6 hours, empiric antibiotics with Rocephin and Zithromax. We will also give empiric Protonix 40 mg IV b.i.d.  3. Coagulopathy: On Coumadin for atrial fibrillation. ER physician ordered FFP to reverse.  4. History of congestive heart failure: Currently no signs.  5. Chronic obstructive pulmonary disease: We will give Flovent and Atrovent on the ventilator, high-dose Solu-Medrol for right now.  6. History of cerebrovascular accident: No anticoagulation since massive hemoptysis.  7. Hypertension: We will continue metoprolol and give nitro paste at this time. Continue to monitor her blood pressure.  8. The patient will be admitted to the Critical Care Unit. The patient is critically ill.   CRITICAL CARE TIME SPENT:   60 minutes.  ____________________________ Tana Conch. Leslye Peer, MD [id number] D: 08/11/2012 19:50:06 ET T: 08/12/2012 07:21:02 ET JOB#: [id number]  cc: Tana Conch. Leslye Peer, MD, <Dictator> Leonie Douglas. Doy Hutching, MD Herbon E. Raul Del, [place] MD ELECTRONICALLY SIGNED 08/19/2012 21:11
